# Patient Record
Sex: Male | Born: 1950 | Race: White | Hispanic: No | Marital: Single | Smoking: Current every day smoker
Health system: Southern US, Community
[De-identification: ages and names within clinical notes are randomized; demographics above are authoritative.]

## PROBLEM LIST (undated history)

## (undated) DIAGNOSIS — F329 Major depressive disorder, single episode, unspecified: Secondary | ICD-10-CM

## (undated) DIAGNOSIS — F419 Anxiety disorder, unspecified: Secondary | ICD-10-CM

## (undated) DIAGNOSIS — M545 Low back pain, unspecified: Secondary | ICD-10-CM

## (undated) DIAGNOSIS — E7800 Pure hypercholesterolemia, unspecified: Secondary | ICD-10-CM

## (undated) DIAGNOSIS — F32A Depression, unspecified: Secondary | ICD-10-CM

## (undated) DIAGNOSIS — G43909 Migraine, unspecified, not intractable, without status migrainosus: Secondary | ICD-10-CM

## (undated) DIAGNOSIS — R413 Other amnesia: Secondary | ICD-10-CM

## (undated) DIAGNOSIS — R630 Anorexia: Secondary | ICD-10-CM

## (undated) DIAGNOSIS — N529 Male erectile dysfunction, unspecified: Secondary | ICD-10-CM

## (undated) DIAGNOSIS — N400 Enlarged prostate without lower urinary tract symptoms: Secondary | ICD-10-CM

## (undated) DIAGNOSIS — F320 Major depressive disorder, single episode, mild: Secondary | ICD-10-CM

## (undated) DIAGNOSIS — K760 Fatty (change of) liver, not elsewhere classified: Secondary | ICD-10-CM

## (undated) DIAGNOSIS — G44009 Cluster headache syndrome, unspecified, not intractable: Secondary | ICD-10-CM

## (undated) HISTORY — PX: SKIN CANCER EXCISION: SHX779

## (undated) HISTORY — DX: Anorexia: R63.0

## (undated) HISTORY — DX: Pure hypercholesterolemia, unspecified: E78.00

## (undated) HISTORY — DX: Major depressive disorder, single episode, mild: F32.0

## (undated) HISTORY — DX: Other amnesia: R41.3

## (undated) HISTORY — DX: Benign prostatic hyperplasia without lower urinary tract symptoms: N40.0

## (undated) HISTORY — DX: Depression, unspecified: F32.A

## (undated) HISTORY — DX: Low back pain, unspecified: M54.50

## (undated) HISTORY — DX: Fatty (change of) liver, not elsewhere classified: K76.0

## (undated) HISTORY — DX: Anxiety disorder, unspecified: F41.9

## (undated) HISTORY — DX: Cluster headache syndrome, unspecified, not intractable: G44.009

## (undated) HISTORY — PX: ANKLE SURGERY: SHX546

## (undated) HISTORY — DX: Migraine, unspecified, not intractable, without status migrainosus: G43.909

## (undated) HISTORY — PX: APPENDECTOMY: SHX54

## (undated) HISTORY — DX: Male erectile dysfunction, unspecified: N52.9

## (undated) HISTORY — DX: Major depressive disorder, single episode, unspecified: F32.9

---

## 2006-07-08 ENCOUNTER — Emergency Department (HOSPITAL_COMMUNITY): Admission: EM | Admit: 2006-07-08 | Discharge: 2006-07-08 | Payer: Self-pay | Admitting: Emergency Medicine

## 2006-07-09 ENCOUNTER — Emergency Department (HOSPITAL_COMMUNITY): Admission: EM | Admit: 2006-07-09 | Discharge: 2006-07-09 | Payer: Self-pay | Admitting: Emergency Medicine

## 2006-07-17 ENCOUNTER — Emergency Department (HOSPITAL_COMMUNITY): Admission: EM | Admit: 2006-07-17 | Discharge: 2006-07-17 | Payer: Self-pay | Admitting: Emergency Medicine

## 2013-06-25 ENCOUNTER — Other Ambulatory Visit: Payer: Self-pay | Admitting: Family Medicine

## 2013-06-25 ENCOUNTER — Ambulatory Visit
Admission: RE | Admit: 2013-06-25 | Discharge: 2013-06-25 | Disposition: A | Discharge status: Home / Self Care | Payer: 59 | Source: Ambulatory Visit | Attending: Family Medicine | Admitting: Family Medicine

## 2013-06-25 DIAGNOSIS — W19XXXA Unspecified fall, initial encounter: Secondary | ICD-10-CM

## 2013-06-25 DIAGNOSIS — R0789 Other chest pain: Secondary | ICD-10-CM

## 2013-06-25 DIAGNOSIS — R05 Cough: Secondary | ICD-10-CM

## 2013-06-25 DIAGNOSIS — R059 Cough, unspecified: Secondary | ICD-10-CM

## 2013-08-12 DIAGNOSIS — C4492 Squamous cell carcinoma of skin, unspecified: Secondary | ICD-10-CM

## 2013-08-12 DIAGNOSIS — C4491 Basal cell carcinoma of skin, unspecified: Secondary | ICD-10-CM

## 2013-08-12 HISTORY — DX: Squamous cell carcinoma of skin, unspecified: C44.92

## 2013-08-12 HISTORY — DX: Basal cell carcinoma of skin, unspecified: C44.91

## 2016-03-23 DIAGNOSIS — C4491 Basal cell carcinoma of skin, unspecified: Secondary | ICD-10-CM

## 2016-03-23 HISTORY — DX: Basal cell carcinoma of skin, unspecified: C44.91

## 2016-12-21 ENCOUNTER — Ambulatory Visit: Payer: Self-pay

## 2016-12-21 ENCOUNTER — Encounter: Payer: Self-pay | Admitting: Podiatry

## 2016-12-21 DIAGNOSIS — S90121A Contusion of right lesser toe(s) without damage to nail, initial encounter: Secondary | ICD-10-CM | POA: Diagnosis not present

## 2016-12-22 ENCOUNTER — Other Ambulatory Visit: Payer: Self-pay | Admitting: Podiatry

## 2016-12-22 ENCOUNTER — Encounter: Payer: Self-pay | Admitting: Podiatry

## 2016-12-22 ENCOUNTER — Ambulatory Visit (INDEPENDENT_AMBULATORY_CARE_PROVIDER_SITE_OTHER): Payer: BLUE CROSS/BLUE SHIELD | Admitting: Podiatry

## 2016-12-22 ENCOUNTER — Ambulatory Visit (INDEPENDENT_AMBULATORY_CARE_PROVIDER_SITE_OTHER): Payer: BLUE CROSS/BLUE SHIELD

## 2016-12-22 VITALS — BP 133/80 | HR 77 | Resp 16

## 2016-12-22 DIAGNOSIS — L03032 Cellulitis of left toe: Secondary | ICD-10-CM | POA: Diagnosis not present

## 2016-12-22 DIAGNOSIS — S99921A Unspecified injury of right foot, initial encounter: Secondary | ICD-10-CM

## 2016-12-22 DIAGNOSIS — M79671 Pain in right foot: Secondary | ICD-10-CM

## 2016-12-22 MED ORDER — CIPROFLOXACIN HCL 500 MG PO TABS: 500.0000 mg | ORAL_TABLET | Freq: Two times a day (BID) | ORAL | 0 refills | Status: DC

## 2016-12-22 MED ORDER — CEPHALEXIN 500 MG PO CAPS: 500.0000 mg | ORAL_CAPSULE | Freq: Three times a day (TID) | ORAL | 1 refills | Status: DC

## 2016-12-22 NOTE — Progress Notes (Signed)
Subjective:    Patient ID: Timothy Santiago, male   DOB: 66 y.o.   MRN: [id number]   HPI patient presents having severe injury to his left toenail with severe tissue formation and pain and states that it happened 3 days ago patient does not smoke currently    Review of Systems  All other systems reviewed and are negative.       Objective:  Physical Exam  Constitutional: He appears well-developed and well-nourished.  Cardiovascular: Intact distal pulses.   Musculoskeletal: Normal range of motion.  Neurological: He is alert.  Skin: Skin is warm.  Nursing note and vitals reviewed.  neurovascular status intact muscle strength adequate range of motion was within normal limits with patient found to have severely damaged left hallux nailbed with dorsal tissue formation and indications of probable underlying bone trauma. It is localized to this area and is painful when palpated     Assessment:    Severe abscess left hallux with probable bone trauma secondary to sustained injury from 3 days ago     Plan:    H&P x-ray reviewed condition discussed. At this time I went ahead and I infiltrated left hallux 60 Milligan times like Marcaine mixture and under sterile conditions I removed the nail and found there is some trauma to the underlying bone structure but it is localized and appears to have a covering with no indication of exposed bone. I did review case with Dr. Jacqualyn Posey and had him look at it with me and we'll both in agreement. I flushed the area out applied sterile dressing instructed him on soaks and dressing usage and open toed shoes and I placed him on Cipro and cephalexin as precautionary measures with the explanation it's possible it bone infection were to occur he may ultimately require a amputation. Patient be reevaluated again in 1 week or earlier if any issues should occur  X-rays indicate there is trauma around the distal phalanx of the hallux but no indications of complete crush  injury

## 2016-12-22 NOTE — Patient Instructions (Signed)

## 2016-12-29 ENCOUNTER — Ambulatory Visit (INDEPENDENT_AMBULATORY_CARE_PROVIDER_SITE_OTHER): Payer: BLUE CROSS/BLUE SHIELD | Admitting: Podiatry

## 2016-12-29 DIAGNOSIS — L03032 Cellulitis of left toe: Secondary | ICD-10-CM

## 2016-12-29 DIAGNOSIS — S99921A Unspecified injury of right foot, initial encounter: Secondary | ICD-10-CM

## 2016-12-29 NOTE — Progress Notes (Signed)
Subjective:    Patient ID: Timothy Santiago, male   DOB: 66 y.o.   MRN: [id number]   HPI patient states that it seems somewhat better but it still throbs at night and there is still drainage and redness    ROS      Objective:  Physical Exam neurovascular status intact with patient's left hallux still mildly red to the interphalangeal joint local with no proximal edema erythema drainage and there still is drainage on the dorsal surface of the left nail bed but I do not see any bone exposure and it appears to be healing over     Assessment:    Hopeful that we are getting coverage of this severely traumatized left hallux with discomfort still present     Plan:    Advised on the continuation of soaks open toed shoes and we will continue antibiotics for 10 more days. I then advised that it still is possible he will require amputation or may have bone infection but at this point things are looking more promising and he will continue along the same course. Strict instructions if any proximal redness or any systemic signs of infection were to occur he is to go straight to the emergency room and if not we will see him back in 3 weeks

## 2017-01-03 NOTE — Progress Notes (Signed)
This encounter was created in error - please disregard.

## 2017-01-19 ENCOUNTER — Ambulatory Visit: Payer: BLUE CROSS/BLUE SHIELD | Admitting: Podiatry

## 2017-01-22 ENCOUNTER — Encounter: Payer: Self-pay | Admitting: Podiatry

## 2017-01-22 ENCOUNTER — Other Ambulatory Visit: Payer: Self-pay | Admitting: Podiatry

## 2017-01-22 ENCOUNTER — Ambulatory Visit (INDEPENDENT_AMBULATORY_CARE_PROVIDER_SITE_OTHER): Payer: BLUE CROSS/BLUE SHIELD | Admitting: Podiatry

## 2017-01-22 ENCOUNTER — Ambulatory Visit (INDEPENDENT_AMBULATORY_CARE_PROVIDER_SITE_OTHER): Payer: BLUE CROSS/BLUE SHIELD

## 2017-01-22 DIAGNOSIS — M79675 Pain in left toe(s): Secondary | ICD-10-CM

## 2017-01-22 DIAGNOSIS — S99929A Unspecified injury of unspecified foot, initial encounter: Secondary | ICD-10-CM

## 2017-01-22 DIAGNOSIS — S99921A Unspecified injury of right foot, initial encounter: Secondary | ICD-10-CM | POA: Diagnosis not present

## 2017-01-22 DIAGNOSIS — L03032 Cellulitis of left toe: Secondary | ICD-10-CM

## 2017-01-23 NOTE — Progress Notes (Signed)
Subjective:    Patient ID: Timothy Santiago, male   DOB: 66 y.o.   MRN: [id number]   HPI patient presents stating I'm not getting any drainage but I still gets some soreness and I've not been wearing shoes on my left big toe    ROS      Objective:  Physical Exam patient had traumatized his left big toe and had bone exposure which appears to be healed over but cannot rule out bone infection     Assessment:   Seems to be improving with probability of crusted-like tissue but no indications currently that there is a bone infection      Plan:     X-ray reviewed with patient and allow patient to return to normal shoe and applied cushion into the top of the toe to try to take pressure off. I discussed one point I may need to remove the nail and that the chances of amputation still exist but are small currently  X-ray indicates there is no indication of bone infection at this time

## 2017-02-14 DIAGNOSIS — F411 Generalized anxiety disorder: Secondary | ICD-10-CM | POA: Diagnosis not present

## 2017-02-14 DIAGNOSIS — Z0001 Encounter for general adult medical examination with abnormal findings: Secondary | ICD-10-CM | POA: Diagnosis not present

## 2017-02-14 DIAGNOSIS — R05 Cough: Secondary | ICD-10-CM | POA: Diagnosis not present

## 2017-02-14 DIAGNOSIS — R197 Diarrhea, unspecified: Secondary | ICD-10-CM | POA: Diagnosis not present

## 2017-02-14 DIAGNOSIS — E7800 Pure hypercholesterolemia, unspecified: Secondary | ICD-10-CM | POA: Diagnosis not present

## 2017-02-14 DIAGNOSIS — N400 Enlarged prostate without lower urinary tract symptoms: Secondary | ICD-10-CM | POA: Diagnosis not present

## 2017-02-14 DIAGNOSIS — Z23 Encounter for immunization: Secondary | ICD-10-CM | POA: Diagnosis not present

## 2017-03-21 DIAGNOSIS — N400 Enlarged prostate without lower urinary tract symptoms: Secondary | ICD-10-CM | POA: Diagnosis not present

## 2017-03-21 DIAGNOSIS — J069 Acute upper respiratory infection, unspecified: Secondary | ICD-10-CM | POA: Diagnosis not present

## 2017-03-21 DIAGNOSIS — F411 Generalized anxiety disorder: Secondary | ICD-10-CM | POA: Diagnosis not present

## 2017-05-14 DIAGNOSIS — D0439 Carcinoma in situ of skin of other parts of face: Secondary | ICD-10-CM | POA: Diagnosis not present

## 2017-05-14 DIAGNOSIS — L570 Actinic keratosis: Secondary | ICD-10-CM | POA: Diagnosis not present

## 2017-05-14 DIAGNOSIS — C44629 Squamous cell carcinoma of skin of left upper limb, including shoulder: Secondary | ICD-10-CM | POA: Diagnosis not present

## 2017-05-14 DIAGNOSIS — D229 Melanocytic nevi, unspecified: Secondary | ICD-10-CM | POA: Diagnosis not present

## 2017-05-26 DIAGNOSIS — J101 Influenza due to other identified influenza virus with other respiratory manifestations: Secondary | ICD-10-CM | POA: Diagnosis not present

## 2017-05-26 DIAGNOSIS — R509 Fever, unspecified: Secondary | ICD-10-CM | POA: Diagnosis not present

## 2017-06-01 DIAGNOSIS — J069 Acute upper respiratory infection, unspecified: Secondary | ICD-10-CM | POA: Diagnosis not present

## 2017-06-18 DIAGNOSIS — G44009 Cluster headache syndrome, unspecified, not intractable: Secondary | ICD-10-CM | POA: Diagnosis not present

## 2017-10-04 DIAGNOSIS — M25561 Pain in right knee: Secondary | ICD-10-CM | POA: Diagnosis not present

## 2017-10-04 DIAGNOSIS — M1711 Unilateral primary osteoarthritis, right knee: Secondary | ICD-10-CM | POA: Diagnosis not present

## 2017-12-06 ENCOUNTER — Ambulatory Visit
Admission: RE | Admit: 2017-12-06 | Discharge: 2017-12-06 | Disposition: A | Discharge status: Home / Self Care | Payer: BLUE CROSS/BLUE SHIELD | Source: Ambulatory Visit | Attending: Family Medicine | Admitting: Family Medicine

## 2017-12-06 ENCOUNTER — Other Ambulatory Visit: Payer: Self-pay

## 2017-12-06 DIAGNOSIS — R109 Unspecified abdominal pain: Secondary | ICD-10-CM

## 2017-12-06 DIAGNOSIS — K5900 Constipation, unspecified: Secondary | ICD-10-CM | POA: Diagnosis not present

## 2017-12-06 DIAGNOSIS — Z1211 Encounter for screening for malignant neoplasm of colon: Secondary | ICD-10-CM | POA: Diagnosis not present

## 2017-12-13 ENCOUNTER — Other Ambulatory Visit: Payer: Self-pay | Admitting: Family Medicine

## 2017-12-13 DIAGNOSIS — R109 Unspecified abdominal pain: Secondary | ICD-10-CM

## 2018-01-01 ENCOUNTER — Other Ambulatory Visit: Payer: Self-pay | Admitting: Gastroenterology

## 2018-01-01 DIAGNOSIS — R1084 Generalized abdominal pain: Secondary | ICD-10-CM | POA: Diagnosis not present

## 2018-01-01 DIAGNOSIS — R110 Nausea: Secondary | ICD-10-CM | POA: Diagnosis not present

## 2018-01-01 DIAGNOSIS — R630 Anorexia: Secondary | ICD-10-CM | POA: Diagnosis not present

## 2018-01-15 ENCOUNTER — Ambulatory Visit
Admission: RE | Admit: 2018-01-15 | Discharge: 2018-01-15 | Disposition: A | Discharge status: Home / Self Care | Payer: BLUE CROSS/BLUE SHIELD | Source: Ambulatory Visit | Attending: Gastroenterology | Admitting: Gastroenterology

## 2018-01-15 DIAGNOSIS — R1084 Generalized abdominal pain: Secondary | ICD-10-CM

## 2018-01-15 DIAGNOSIS — K760 Fatty (change of) liver, not elsewhere classified: Secondary | ICD-10-CM | POA: Diagnosis not present

## 2018-01-15 MED ORDER — IOPAMIDOL (ISOVUE-300) INJECTION 61%
100.0000 mL | Freq: Once | INTRAVENOUS | Status: AC | PRN
  Administered 2018-01-15: 125 mL via INTRAVENOUS

## 2018-01-22 DIAGNOSIS — K760 Fatty (change of) liver, not elsewhere classified: Secondary | ICD-10-CM | POA: Diagnosis not present

## 2018-01-30 DIAGNOSIS — K3189 Other diseases of stomach and duodenum: Secondary | ICD-10-CM | POA: Diagnosis not present

## 2018-01-30 DIAGNOSIS — R1110 Vomiting, unspecified: Secondary | ICD-10-CM | POA: Diagnosis not present

## 2018-01-30 DIAGNOSIS — K2930 Chronic superficial gastritis without bleeding: Secondary | ICD-10-CM | POA: Diagnosis not present

## 2018-02-01 DIAGNOSIS — K2930 Chronic superficial gastritis without bleeding: Secondary | ICD-10-CM | POA: Diagnosis not present

## 2018-04-05 DIAGNOSIS — F320 Major depressive disorder, single episode, mild: Secondary | ICD-10-CM | POA: Diagnosis not present

## 2018-04-05 DIAGNOSIS — R413 Other amnesia: Secondary | ICD-10-CM | POA: Diagnosis not present

## 2018-04-05 DIAGNOSIS — M25561 Pain in right knee: Secondary | ICD-10-CM | POA: Diagnosis not present

## 2018-04-05 DIAGNOSIS — F411 Generalized anxiety disorder: Secondary | ICD-10-CM | POA: Diagnosis not present

## 2018-04-16 ENCOUNTER — Other Ambulatory Visit: Payer: Self-pay

## 2018-04-16 ENCOUNTER — Encounter: Payer: Self-pay | Admitting: Neurology

## 2018-04-16 ENCOUNTER — Ambulatory Visit (INDEPENDENT_AMBULATORY_CARE_PROVIDER_SITE_OTHER): Payer: BLUE CROSS/BLUE SHIELD | Admitting: Neurology

## 2018-04-16 VITALS — BP 169/105 | HR 67 | Ht 76.0 in | Wt 270.0 lb

## 2018-04-16 DIAGNOSIS — R413 Other amnesia: Secondary | ICD-10-CM | POA: Insufficient documentation

## 2018-04-16 DIAGNOSIS — E559 Vitamin D deficiency, unspecified: Secondary | ICD-10-CM | POA: Diagnosis not present

## 2018-04-16 DIAGNOSIS — F332 Major depressive disorder, recurrent severe without psychotic features: Secondary | ICD-10-CM

## 2018-04-16 DIAGNOSIS — R0683 Snoring: Secondary | ICD-10-CM | POA: Diagnosis not present

## 2018-04-16 MED ORDER — BUPROPION HCL ER (XL) 300 MG PO TB24: 300.0000 mg | ORAL_TABLET | Freq: Every day | ORAL | 11 refills | Status: DC

## 2018-04-16 NOTE — Progress Notes (Signed)
[facility]  PATIENT: Timothy Santiago DOB: 1951/03/13  REFERRING DOCTOR OR PCP: Donnie Coffin, MD SOURCE: Patient, notes from Dr. Alroy Dust, reviewed ESS and MoCA scales  _________________________________   HISTORICAL  CHIEF COMPLAINT:  Chief Complaint  Patient presents with  . New Patient (Initial Visit)    RM 12, alone. Paper referral from Dr. Alroy Dust for memory problems. He reports he has severe depression currently. Pt reports sx worsened within the last year. PCP could not figure out what was going on. Pt has family hx of anxiety/depression. He has been on antidepressant in the past but cannot remember the name. It was ineffective. He has also been on medication for anxiety.   . Memory Loss    Having general memory issues. Oldest son has ADD. He has been told he does as well. He has tried ritalin in the past but could not tolerate.  Feels mind is in a "cloud". Cannot focus. Having more difficulty with short term memory.     HISTORY OF PRESENT ILLNESS:  I had the pleasure of seeing your patient, Timothy Santiago, at [facility] for neurologic consultation regarding his memory loss.  He is a 68 y.o. man who first noted difficulty with short term memory about a year ago.  His family has noted that he often repeats himself.   In general, he notes more difficulty with short term memory than his family or others.     He is working and travels a lot for his job.   He is in Press photographer (food products to schools).  He recalls recently filling up on gas to return his car rental and then turning into a gas station to fill up again.   He does feel that he is having more trouble on his job.    He has never been good at math but is not noting mor errors with the financial component of his job.   He has to plan trips and talks.  He notes depression and also has attention deficit disorder.   He sees Dr. Alroy Dust.   The duloxetine was recently increased from 60 to 120  mg.   He also has had anxiety.   He notes most of his family has depression.    He has had trouble with mood issues x many years.   In the past, he had tried Ritalin but felt like he drank 30 cups of coffee so did not continue.   He feels very depressed but notes no suicidal ideation.    He has apathy -- no longer cleaning hte house and not doing activities he once enjoyed.    He has been drinking more, 2-4 drinks a day.    He feels it relaxes him.   His first depression occurred in his 33's.      He has some insomnia but averages 7 hours.   He snores but has not been noted to have pauses, snorts, gasps.   He sleeps alone, however, so only has a couple nights a year where his son is present at night.   He has never had a sleep study.      He denies significant medical illnesses.   No thyroid disease, known vitamin deficiency, DM.      He has no brain imaging.     His paternal grandmother had dementia at a late age.       Montreal Cognitive Assessment  04/16/2018  Visuospatial/ Executive (0/5) 3  Naming (0/3) 3  Attention: Read list of digits (0/2) 2  Attention: Read list of letters (0/1) 1  Attention: Serial 7 subtraction starting at 100 (0/3) 3  Language: Repeat phrase (0/2) 2  Language : Fluency (0/1) 0  Abstraction (0/2) 2  Delayed Recall (0/5) 2  Orientation (0/6) 6  Total 24  Adjusted Score (based on education) 24     EPWORTH SLEEPINESS SCALE  On a scale of 0 - 3 what is the chance of dozing:  Sitting and Reading:   0 Watching TV:    2 Sitting inactive in a public place:  1 Passenger in car for one hour:  2 Lying down to rest in the afternoon: 0 Sitting and talking to someone:  0 Sitting quietly after lunch:  0 In a car, stopped in traffic:  0  Total (out of 24):   5/24  Normal.   REVIEW OF SYSTEMS: Constitutional: No fevers, chills, sweats, or change in appetite.  He has occasional insomnia Eyes: No visual changes, double vision, eye pain Ear, nose and throat: No  hearing loss, ear pain, nasal congestion, sore throat Cardiovascular: No chest pain, palpitations Respiratory: No shortness of breath at rest or with exertion.   No wheezes.  He snores,  GastrointestinaI: No nausea, vomiting, diarrhea, abdominal pain, fecal incontinence Genitourinary: No dysuria, urinary retention or frequency.  No nocturia. Musculoskeletal: No neck pain, back pain Integumentary: No rash, pruritus, skin lesions Neurological: as above Psychiatric: No depression at this time.  No anxiety Endocrine: No palpitations, diaphoresis, change in appetite, change in weigh or increased thirst Hematologic/Lymphatic: No anemia, purpura, petechiae. Allergic/Immunologic: No itchy/runny eyes, nasal congestion, recent allergic reactions, rashes  ALLERGIES: Allergies  Allergen Reactions  . Codeine     Upset stomach    HOME MEDICATIONS:  Current Outpatient Medications:  .  ALPRAZolam (XANAX) 0.25 MG tablet, Take 0.5 mg by mouth at bedtime as needed for anxiety., Disp: , Rfl:  .  busPIRone (BUSPAR) 15 MG tablet, Take 15 mg by mouth 2 (two) times daily., Disp: , Rfl:  .  DULoxetine (CYMBALTA) 60 MG capsule, Take 60 mg by mouth daily., Disp: , Rfl:  .  tamsulosin (FLOMAX) 0.4 MG CAPS capsule, Take 0.8 mg by mouth daily., Disp: , Rfl:  .  buPROPion (WELLBUTRIN XL) 300 MG 24 hr tablet, Take 1 tablet (300 mg total) by mouth daily., Disp: 30 tablet, Rfl: 11  PAST MEDICAL HISTORY: Past Medical History:  Diagnosis Date  . Anxiety   . Depression   . Migraine     PAST SURGICAL HISTORY: Past Surgical History:  Procedure Laterality Date  . ANKLE SURGERY Left   . APPENDECTOMY    . SKIN CANCER EXCISION      FAMILY HISTORY: Family History  Problem Relation Age of Onset  . Depression Father   . Depression Sister   . Anxiety disorder Sister     SOCIAL HISTORY:  Social History   Socioeconomic History  . Marital status: Single    Spouse name: Not on file  . Number of  children: 2  . Years of education: 47  . Highest education level: Not on file  Occupational History  . Occupation: Investment banker, corporate  Social Needs  . Financial resource strain: Not on file  . Food insecurity:    Worry: Not on file    Inability: Not on file  . Transportation needs:    Medical: Not on file    Non-medical: Not on file  Tobacco Use  . Smoking  status: Smoker, Current Status Unknown    Packs/day: 1.00  . Smokeless tobacco: Former Network engineer and Sexual Activity  . Alcohol use: Yes    Comment: 1-4 drinks per day  . Drug use: Never  . Sexual activity: Not on file  Lifestyle  . Physical activity:    Days per week: Not on file    Minutes per session: Not on file  . Stress: Not on file  Relationships  . Social connections:    Talks on phone: Not on file    Gets together: Not on file    Attends religious service: Not on file    Active member of club or organization: Not on file    Attends meetings of clubs or organizations: Not on file    Relationship status: Not on file  . Intimate partner violence:    Fear of current or ex partner: Not on file    Emotionally abused: Not on file    Physically abused: Not on file    Forced sexual activity: Not on file  Other Topics Concern  . Not on file  Social History Narrative   Right handed    Caffeine use: coffee 2-3 x per week   Lives alone     PHYSICAL EXAM  Vitals:   04/16/18 1451  BP: (!) 169/105  Pulse: 67  Weight: 270 lb (122.5 kg)  Height: 6\' 4"  (1.93 m)    Body mass index is 32.87 kg/m.   General: The patient is well-developed and well-nourished and in no acute distress.  Pharynx is Mallampati 2  Neck: The neck is supple, no carotid bruits are noted.  The neck is nontender with good range of motion  Cardiovascular: The heart has a regular rate and rhythm with a normal S1 and S2. There were no murmurs, gallops or rubs.    Skin: Extremities are without rash or edema.  Neurologic  Exam  Mental status: The patient is alert and oriented x 3 at the time of the examination.  He scored 20/30 on the [facility] cognitive assessment.  Individual scores are in the history.  Speech is normal.  Cranial nerves: Extraocular movements are full. There is good facial sensation to soft touch bilaterally.Facial strength is normal.  Trapezius and sternocleidomastoid strength is normal. No dysarthria is noted.  The tongue is midline, and the patient has symmetric elevation of the soft palate. No obvious hearing deficits are noted.  Motor:  Muscle bulk is normal.   Tone is normal. Strength is  5 / 5 in all 4 extremities.   Sensory: Sensory testing is intact to pinprick, soft touch and vibration sensation in all 4 extremities.  Coordination: Cerebellar testing reveals good finger-nose-finger and heel-to-shin bilaterally.  Gait and station: Station is normal.   Gait is normal. Tandem gait is normal. Romberg is negative.   Reflexes: Deep tendon reflexes are symmetric and normal bilaterally.   Plantar responses are flexor.    DIAGNOSTIC DATA (LABS, IMAGING, TESTING) - I reviewed patient records, labs, notes, testing and imaging myself where available.       ASSESSMENT AND PLAN  Severe episode of recurrent major depressive disorder, without psychotic features ([place])  Memory loss - Plan: Thyroid Panel With TSH, Vitamin B12, MR BRAIN WO CONTRAST  Vitamin D deficiency - Plan: VITAMIN D 25 Hydroxy (Vit-D Deficiency, Fractures)  Snoring   In summary, Timothy Santiago is a 68 year old man with a 1 year history of memory loss.  He he reports being more  aware of his memory difficulties than others.  He continues to work.  He also has had difficulties with depression that has been worse the past year.  I had a long discussion with Timothy Santiago about memory difficulties.  In middle age, issues affecting focus and attention are more likely to cause difficulties with memory and degenerative processes  such as Alzheimer's.  I am concerned that his depression is fairly severe and has not responded well to therapy so far.  His Cymbalta was recently increased.  I am going to add Wellbutrin to see if that would benefit him further.  Because he also missed some points on the [facility] cognitive assessment for other topics besides recall, it is still possible that there is a degenerative process.  We will check an MRI of the brain to determine if it is compatible with Alzheimer's disease (hippocampal atrophy) frontotemporal dementia or vascular dementia.  Poor sleep can also lead to cognitive dysfunction and middle-age.  He does have snoring but does not have excessive daytime sleepiness.  If he does not improve and the initial evaluation is negative, we will consider a sleep study.  Additionally I will check blood work including TSH, vitamin D and vitamin B12 we will supplement  as needed.  He will return to see me in 3 months or sooner if there are new or worsening neurologic symptoms.  Thank you for asking me to see Timothy Santiago.  Please let me know if I can be of further assistance with him or other patients in the future.   Richard A. Felecia Shelling, MD, [facility] 7/97/2820, 60:15 PM Certified in Neurology, Clinical Neurophysiology, Sleep Medicine, Pain Medicine and Neuroimaging  [facility] [address], [place], [place] [address] [phone]

## 2018-04-17 ENCOUNTER — Telehealth: Payer: Self-pay | Admitting: *Deleted

## 2018-04-17 LAB — THYROID PANEL WITH TSH
FREE THYROXINE INDEX: 2 (ref 1.2–4.9)
T3 UPTAKE RATIO: 28 % (ref 24–39)
T4, Total: 7.1 ug/dL (ref 4.5–12.0)
TSH: 2.41 u[IU]/mL (ref 0.450–4.500)

## 2018-04-17 LAB — VITAMIN B12: Vitamin B-12: 519 pg/mL (ref 232–1245)

## 2018-04-17 LAB — VITAMIN D 25 HYDROXY (VIT D DEFICIENCY, FRACTURES): Vit D, 25-Hydroxy: 6.6 ng/mL — ABNORMAL LOW (ref 30.0–100.0)

## 2018-04-17 MED ORDER — VITAMIN D (ERGOCALCIFEROL) 1.25 MG (50000 UNIT) PO CAPS: 50000.0000 [IU] | ORAL_CAPSULE | ORAL | 3 refills | Status: AC

## 2018-04-17 NOTE — Telephone Encounter (Signed)
-----   Message from Britt Bottom, MD sent at 04/17/2018  2:37 PM EST ----- Vitamin D is very low and he needs high-dose supplementation.  50,000 units weekly x1 year   #13  # 3 refills    Other labs were fine

## 2018-04-17 NOTE — Telephone Encounter (Signed)
Called, LVM for pt about lab results. Gave [facility] phone number if he has further questions.  [facility].

## 2018-04-18 ENCOUNTER — Telehealth: Payer: Self-pay | Admitting: Neurology

## 2018-04-18 NOTE — Telephone Encounter (Signed)
Medicare/BCBS Auth: [id number] (exp. 04/18/18 to 06/17/18) lvm for pt to be aware. I also left [facility] phone number of [phone] and to give them a call in the next 2-3 business days if he hasn't heard.

## 2018-05-11 ENCOUNTER — Ambulatory Visit
Admission: RE | Admit: 2018-05-11 | Discharge: 2018-05-11 | Disposition: A | Discharge status: Home / Self Care | Payer: BLUE CROSS/BLUE SHIELD | Source: Ambulatory Visit | Attending: Neurology | Admitting: Neurology

## 2018-05-11 DIAGNOSIS — R413 Other amnesia: Secondary | ICD-10-CM | POA: Diagnosis not present

## 2018-05-17 ENCOUNTER — Telehealth: Payer: Self-pay | Admitting: *Deleted

## 2018-05-17 NOTE — Telephone Encounter (Signed)
-----   Message from Britt Bottom, MD sent at 05/17/2018  1:03 PM EST ----- please let him know the MRI of the brain does show some atrophy and more age related changes than is typical.   This is likely contributing to his memory issues

## 2018-05-17 NOTE — Telephone Encounter (Signed)
I called and spoke with pt about MRI results. Pt verbalized understanding.

## 2018-06-27 ENCOUNTER — Telehealth: Payer: Self-pay | Admitting: Neurology

## 2018-06-27 MED ORDER — DULOXETINE HCL 60 MG PO CPEP: 60.0000 mg | ORAL_CAPSULE | Freq: Every day | ORAL | 5 refills | Status: DC

## 2018-06-27 NOTE — Telephone Encounter (Signed)
Pt states he was told Dr Felecia Shelling would increase his taking DULoxetine (CYMBALTA) 60 MG capsule once a day to twice a day.  Pt states the pharmacy  [id number] has never received the revised prescription for him going from 1 a day to 2 a day.  Pt is asking the script be sent to his pharmacy as he will soon run out of remaining pills

## 2018-06-27 NOTE — Telephone Encounter (Signed)
I called and spoke with patient and made him aware of Dr. Garth Bigness recommendations. He voiced understanding and appreciation.

## 2018-06-27 NOTE — Addendum Note (Signed)
Addended by: Arlice Colt A on: 06/27/2018 04:03 PM   Modules accepted: Orders

## 2018-07-02 DIAGNOSIS — D485 Neoplasm of uncertain behavior of skin: Secondary | ICD-10-CM | POA: Diagnosis not present

## 2018-07-02 DIAGNOSIS — L570 Actinic keratosis: Secondary | ICD-10-CM | POA: Diagnosis not present

## 2018-07-18 DIAGNOSIS — C44622 Squamous cell carcinoma of skin of right upper limb, including shoulder: Secondary | ICD-10-CM | POA: Diagnosis not present

## 2018-10-22 ENCOUNTER — Ambulatory Visit: Payer: Medicare Other | Admitting: Neurology

## 2018-10-23 ENCOUNTER — Encounter: Payer: Self-pay | Admitting: Neurology

## 2018-11-13 ENCOUNTER — Encounter: Payer: Self-pay | Admitting: Neurology

## 2018-11-13 ENCOUNTER — Ambulatory Visit: Payer: Medicare Other | Admitting: Neurology

## 2018-11-20 ENCOUNTER — Telehealth: Payer: Self-pay | Admitting: Neurology

## 2018-11-20 DIAGNOSIS — F1020 Alcohol dependence, uncomplicated: Secondary | ICD-10-CM

## 2018-11-20 DIAGNOSIS — F339 Major depressive disorder, recurrent, unspecified: Secondary | ICD-10-CM

## 2018-11-20 NOTE — Telephone Encounter (Signed)
Dr. Felecia Shelling- would you like him to see psychiatry? I see a note from 06/2018 that you would recommend that next

## 2018-11-20 NOTE — Telephone Encounter (Signed)
Because he is continued to have difficulty with depression despite changes in medication, I think we need to refer him to psychiatry for further evaluation.  If he feels suicidal or that he might hurt himself or others he should present to the emergency room.

## 2018-11-20 NOTE — Telephone Encounter (Signed)
Pt has called and rescheduled his 3 month f/u and he is on wait list.  Pt states that it is very important that he be seen as soon as possible because of his depression.  Pt states the medication called in for him worked for about 2-3 weeks but no longer.  Pt states he is an alcoholic and has recently loss his job because of his issues.  Please call

## 2018-11-20 NOTE — Telephone Encounter (Signed)
I called and spoke with pt. I relayed Dr. Garth Bigness message. He denies having any thoughts of suicide or hurting himself. He knows to go to ED if he does. He is agreeable to referral to psychiatry. He does not have preference of who to be referred to. I placed referreal. Advised he will be called to schedule appt. He verbalized understanding.

## 2018-11-20 NOTE — Addendum Note (Signed)
Addended by: Hope Pigeon on: 11/20/2018 03:25 PM   Modules accepted: Orders

## 2018-12-16 DIAGNOSIS — L814 Other melanin hyperpigmentation: Secondary | ICD-10-CM | POA: Diagnosis not present

## 2018-12-16 DIAGNOSIS — L570 Actinic keratosis: Secondary | ICD-10-CM | POA: Diagnosis not present

## 2018-12-16 DIAGNOSIS — D225 Melanocytic nevi of trunk: Secondary | ICD-10-CM | POA: Diagnosis not present

## 2018-12-16 DIAGNOSIS — D0439 Carcinoma in situ of skin of other parts of face: Secondary | ICD-10-CM | POA: Diagnosis not present

## 2018-12-16 DIAGNOSIS — L821 Other seborrheic keratosis: Secondary | ICD-10-CM | POA: Diagnosis not present

## 2018-12-16 DIAGNOSIS — C44622 Squamous cell carcinoma of skin of right upper limb, including shoulder: Secondary | ICD-10-CM | POA: Diagnosis not present

## 2018-12-16 DIAGNOSIS — Z85828 Personal history of other malignant neoplasm of skin: Secondary | ICD-10-CM | POA: Diagnosis not present

## 2018-12-16 DIAGNOSIS — C4441 Basal cell carcinoma of skin of scalp and neck: Secondary | ICD-10-CM | POA: Diagnosis not present

## 2018-12-16 DIAGNOSIS — C44729 Squamous cell carcinoma of skin of left lower limb, including hip: Secondary | ICD-10-CM | POA: Diagnosis not present

## 2018-12-16 DIAGNOSIS — D485 Neoplasm of uncertain behavior of skin: Secondary | ICD-10-CM | POA: Diagnosis not present

## 2019-01-15 ENCOUNTER — Telehealth: Payer: Self-pay | Admitting: Neurology

## 2019-01-15 NOTE — Telephone Encounter (Signed)
I called patient regarding confirming 01/16/19 appointment. I spoke with patient on the number listed in chart ([phone]) and I advised patient to arrive at our office at 12:30 to check-in for 1:00 appointment. I also advised patient that due to ongoing COVID-19 guidelines, it is encouraged that patient come alone to appointment if possible. Patient stated "okay" and we ended call.

## 2019-01-16 ENCOUNTER — Encounter

## 2019-01-16 ENCOUNTER — Ambulatory Visit: Payer: Medicare Other | Admitting: Neurology

## 2019-04-14 ENCOUNTER — Encounter: Payer: Self-pay | Admitting: Family Medicine

## 2019-04-14 ENCOUNTER — Ambulatory Visit: Payer: Medicare Other | Admitting: Family Medicine

## 2019-04-14 ENCOUNTER — Other Ambulatory Visit: Payer: Self-pay

## 2019-04-14 ENCOUNTER — Other Ambulatory Visit: Payer: Self-pay | Admitting: Neurology

## 2019-04-14 VITALS — BP 168/93 | HR 87 | Temp 97.1°F | Ht 76.0 in | Wt 243.4 lb

## 2019-04-14 DIAGNOSIS — F332 Major depressive disorder, recurrent severe without psychotic features: Secondary | ICD-10-CM

## 2019-04-14 DIAGNOSIS — R413 Other amnesia: Secondary | ICD-10-CM

## 2019-04-14 NOTE — Progress Notes (Signed)
I have read the note, and I agree with the clinical assessment and plan.  Khloie Hamada A. Thaine Garriga, MD, PhD, FAAN Certified in Neurology, Clinical Neurophysiology, Sleep Medicine, Pain Medicine and Neuroimaging  [facility] [address], [address] [place],  [address] [phone]  

## 2019-04-14 NOTE — Patient Instructions (Signed)
We will continue duloxetine and bupropion as prescribed   I have referred you to psychiatry to assist with medications and manage anxiety/depression  Follow up with [place] as needed once established with psychiatry.   Memory Compensation Strategies  1. Use "WARM" strategy.  W= write it down  A= associate it  R= repeat it  M= make a mental note  2.   You can keep a Social worker.  Use a 3-ring notebook with sections for the following: calendar, important names and phone numbers,  medications, doctors' names/phone numbers, lists/reminders, and a section to journal what you did  each day.   3.    Use a calendar to write appointments down.  4.    Write yourself a schedule for the day.  This can be placed on the calendar or in a separate section of the Memory Notebook.  Keeping a  regular schedule can help memory.  5.    Use medication organizer with sections for each day or morning/evening pills.  You may need help loading it  6.    Keep a basket, or pegboard by the door.  Place items that you need to take out with you in the basket or on the pegboard.  You may also want to  include a message board for reminders.  7.    Use sticky notes.  Place sticky notes with reminders in a place where the task is performed.  For example: " turn off the  stove" placed by the stove, "lock the door" placed on the door at eye level, " take your medications" on  the bathroom mirror or by the place where you normally take your medications.  8.    Use alarms/timers.  Use while cooking to remind yourself to check on food or as a reminder to take your medicine, or as a  reminder to make a call, or as a reminder to perform another task, etc.    Hypertension, Adult Hypertension is another name for high blood pressure. High blood pressure forces your heart to work harder to pump blood. This can cause problems over time. There are two numbers in a blood pressure reading. There is a top number (systolic) over a  bottom number (diastolic). It is best to have a blood pressure that is below 120/80. Healthy choices can help lower your blood pressure, or you may need medicine to help lower it. What are the causes? The cause of this condition is not known. Some conditions may be related to high blood pressure. What increases the risk?  Smoking.  Having type 2 diabetes mellitus, high cholesterol, or both.  Not getting enough exercise or physical activity.  Being overweight.  Having too much fat, sugar, calories, or salt (sodium) in your diet.  Drinking too much alcohol.  Having long-term (chronic) kidney disease.  Having a family history of high blood pressure.  Age. Risk increases with age.  Race. You may be at higher risk if you are African American.  Gender. Men are at higher risk than women before age 40. After age 21, women are at higher risk than men.  Having obstructive sleep apnea.  Stress. What are the signs or symptoms?  High blood pressure may not cause symptoms. Very high blood pressure (hypertensive crisis) may cause: ? Headache. ? Feelings of worry or nervousness (anxiety). ? Shortness of breath. ? Nosebleed. ? A feeling of being sick to your stomach (nausea). ? Throwing up (vomiting). ? Changes in how you see. ?  Very bad chest pain. ? Seizures. How is this treated?  This condition is treated by making healthy lifestyle changes, such as: ? Eating healthy foods. ? Exercising more. ? Drinking less alcohol.  Your health care provider may prescribe medicine if lifestyle changes are not enough to get your blood pressure under control, and if: ? Your top number is above 130. ? Your bottom number is above 80.  Your personal target blood pressure may vary. Follow these instructions at home: Eating and drinking   If told, follow the DASH eating plan. To follow this plan: ? Fill one half of your plate at each meal with fruits and vegetables. ? Fill one fourth of your  plate at each meal with whole grains. Whole grains include whole-wheat pasta, brown rice, and whole-grain bread. ? Eat or drink low-fat dairy products, such as skim milk or low-fat yogurt. ? Fill one fourth of your plate at each meal with low-fat (lean) proteins. Low-fat proteins include fish, chicken without skin, eggs, beans, and tofu. ? Avoid fatty meat, cured and processed meat, or chicken with skin. ? Avoid pre-made or processed food.  Eat less than 1,500 mg of salt each day.  Do not drink alcohol if: ? Your doctor tells you not to drink. ? You are pregnant, may be pregnant, or are planning to become pregnant.  If you drink alcohol: ? Limit how much you use to:  0-1 drink a day for women.  0-2 drinks a day for men. ? Be aware of how much alcohol is in your drink. In the [place] one drink equals one 12 oz bottle of beer (355 mL), one 5 oz glass of wine (148 mL), or one 1 oz glass of hard liquor (44 mL). Lifestyle   Work with your doctor to stay at a healthy weight or to lose weight. Ask your doctor what the best weight is for you.  Get at least 30 minutes of exercise most days of the week. This may include walking, swimming, or biking.  Get at least 30 minutes of exercise that strengthens your muscles (resistance exercise) at least 3 days a week. This may include lifting weights or doing Pilates.  Do not use any products that contain nicotine or tobacco, such as cigarettes, e-cigarettes, and chewing tobacco. If you need help quitting, ask your doctor.  Check your blood pressure at home as told by your doctor.  Keep all follow-up visits as told by your doctor. This is important. Medicines  Take over-the-counter and prescription medicines only as told by your doctor. Follow directions carefully.  Do not skip doses of blood pressure medicine. The medicine does not work as well if you skip doses. Skipping doses also puts you at risk for problems.  Ask your doctor about side  effects or reactions to medicines that you should watch for. Contact a doctor if you:  Think you are having a reaction to the medicine you are taking.  Have headaches that keep coming back (recurring).  Feel dizzy.  Have swelling in your ankles.  Have trouble with your vision. Get help right away if you:  Get a very bad headache.  Start to feel mixed up (confused).  Feel weak or numb.  Feel faint.  Have very bad pain in your: ? Chest. ? Belly (abdomen).  Throw up more than once.  Have trouble breathing. Summary  Hypertension is another name for high blood pressure.  High blood pressure forces your heart to work harder to pump  blood.  For most people, a normal blood pressure is less than 120/80.  Making healthy choices can help lower blood pressure. If your blood pressure does not get lower with healthy choices, you may need to take medicine. This information is not intended to replace advice given to you by your health care provider. Make sure you discuss any questions you have with your health care provider. Document Revised: 11/14/2017 Document Reviewed: 11/14/2017 Elsevier Patient Education  2020 [employer].

## 2019-04-14 NOTE — Progress Notes (Signed)
PATIENT: Timothy Santiago DOB: 29-Oct-1950  REASON FOR VISIT: follow up HISTORY FROM: patient  Chief Complaint  Patient presents with  . Follow-up    Yearly f/u. Alone. Rm 2. No new concerns at this time.      HISTORY OF PRESENT ILLNESS: Today 04/14/19 Timothy Santiago is a 69 y.o. male here today for follow up. He continues duloxetine 60mg  and bupropion 300mg  daily. He feels that depression is stable. He does admit that there are days where he feels better and days where he feels really down.  No suicidal ideations.  He was referred to psychiatry in September but states that he never heard anything back.  He feels that he is doing well on current regimen but does feel that formal evaluation with psychiatry is beneficial.  He feels that memory waxes and wanes.  There have been no concerning events since last being seen.  He does continue to have trouble recalling information.  He states a previous history of ADD.  He has not monitor blood pressures at home.  I have reviewed with him blood pressure reading from Dr. Garth Bigness visit last year as well as today's reading.  He denies chest pain or trouble breathing.  HISTORY: (copied from Dr Garth Bigness note on 04/16/2018)  I had the pleasure of seeing your patient, Timothy Santiago, at [facility] for neurologic consultation regarding his memory loss.  He is a 69 y.o. man who first noted difficulty with short term memory about a year ago.  His family has noted that he often repeats himself.   In general, he notes more difficulty with short term memory than his family or others.     He is working and travels a lot for his job.   He is in Press photographer (food products to schools).  He recalls recently filling up on gas to return his car rental and then turning into a gas station to fill up again.   He does feel that he is having more trouble on his job.    He has never been good at math but is not noting mor errors with the financial component of  his job.   He has to plan trips and talks.  He notes depression and also has attention deficit disorder.   He sees Dr. Alroy Dust.   The duloxetine was recently increased from 60 to 120 mg.   He also has had anxiety.   He notes most of his family has depression.    He has had trouble with mood issues x many years.   In the past, he had tried Ritalin but felt like he drank 30 cups of coffee so did not continue.   He feels very depressed but notes no suicidal ideation.    He has apathy -- no longer cleaning hte house and not doing activities he once enjoyed.    He has been drinking more, 2-4 drinks a day.    He feels it relaxes him.   His first depression occurred in his 80's.      He has some insomnia but averages 7 hours.   He snores but has not been noted to have pauses, snorts, gasps.   He sleeps alone, however, so only has a couple nights a year where his son is present at night.   He has never had a sleep study.      He denies significant medical illnesses.   No thyroid disease, known vitamin deficiency, DM.  He has no brain imaging.     His paternal grandmother had dementia at a late age.       Montreal Cognitive Assessment  04/16/2018  Visuospatial/ Executive (0/5) 3  Naming (0/3) 3  Attention: Read list of digits (0/2) 2  Attention: Read list of letters (0/1) 1  Attention: Serial 7 subtraction starting at 100 (0/3) 3  Language: Repeat phrase (0/2) 2  Language : Fluency (0/1) 0  Abstraction (0/2) 2  Delayed Recall (0/5) 2  Orientation (0/6) 6  Total 24  Adjusted Score (based on education) 24     EPWORTH SLEEPINESS SCALE  On a scale of 0 - 3 what is the chance of dozing:  Sitting and Reading:                                 0 Watching TV:                                            2 Sitting inactive in a public place:              1 Passenger in car for one hour:                 2 Lying down to rest in the afternoon:         0 Sitting and talking to someone:                 0 Sitting quietly after lunch:                         0 In a car, stopped in traffic:                        0  Total (out of 24):   5/24  Normal.    REVIEW OF SYSTEMS: Out of a complete 14 system review of symptoms, the patient complains only of the following symptoms, depression, anxiety, memory loss and all other reviewed systems are negative.  ALLERGIES: Allergies  Allergen Reactions  . Codeine     Upset stomach    HOME MEDICATIONS: Outpatient Medications Prior to Visit  Medication Sig Dispense Refill  . ALPRAZolam (XANAX) 0.25 MG tablet Take 0.5 mg by mouth at bedtime as needed for anxiety.    Marland Kitchen buPROPion (WELLBUTRIN XL) 300 MG 24 hr tablet Take 1 tablet (300 mg total) by mouth daily. 30 tablet 11  . DULoxetine (CYMBALTA) 60 MG capsule Take 1 capsule (60 mg total) by mouth daily. 60 capsule 5  . tamsulosin (FLOMAX) 0.4 MG CAPS capsule Take 0.8 mg by mouth daily.    . Vitamin D, Ergocalciferol, (DRISDOL) 1.25 MG (50000 UT) CAPS capsule Take 1 capsule (50,000 Units total) by mouth every 7 (seven) days. 13 capsule 3  . busPIRone (BUSPAR) 15 MG tablet Take 15 mg by mouth 2 (two) times daily.     No facility-administered medications prior to visit.    PAST MEDICAL HISTORY: Past Medical History:  Diagnosis Date  . Anxiety   . Depression   . Migraine     PAST SURGICAL HISTORY: Past Surgical History:  Procedure Laterality Date  . ANKLE SURGERY Left   . APPENDECTOMY    . SKIN CANCER EXCISION  FAMILY HISTORY: Family History  Problem Relation Age of Onset  . Depression Father   . Depression Sister   . Anxiety disorder Sister     SOCIAL HISTORY: Social History   Socioeconomic History  . Marital status: Single    Spouse name: Not on file  . Number of children: 2  . Years of education: 88  . Highest education level: Not on file  Occupational History  . Occupation: Investment banker, corporate  Tobacco Use  . Smoking status: Smoker, Current Status Timothy      Packs/day: 1.00  . Smokeless tobacco: Former Network engineer and Sexual Activity  . Alcohol use: Yes    Comment: 1-4 drinks per day  . Drug use: Never  . Sexual activity: Not on file  Other Topics Concern  . Not on file  Social History Narrative   Right handed    Caffeine use: coffee 2-3 x per week   Lives alone   Social Determinants of Health   Financial Resource Strain:   . Difficulty of Paying Living Expenses: Not on file  Food Insecurity:   . Worried About Charity fundraiser in the Last Year: Not on file  . Ran Out of Food in the Last Year: Not on file  Transportation Needs:   . Lack of Transportation (Medical): Not on file  . Lack of Transportation (Non-Medical): Not on file  Physical Activity:   . Days of Exercise per Week: Not on file  . Minutes of Exercise per Session: Not on file  Stress:   . Feeling of Stress : Not on file  Social Connections:   . Frequency of Communication with Friends and Family: Not on file  . Frequency of Social Gatherings with Friends and Family: Not on file  . Attends Religious Services: Not on file  . Active Member of Clubs or Organizations: Not on file  . Attends Archivist Meetings: Not on file  . Marital Status: Not on file  Intimate Partner Violence:   . Fear of Current or Ex-Partner: Not on file  . Emotionally Abused: Not on file  . Physically Abused: Not on file  . Sexually Abused: Not on file      PHYSICAL EXAM  Vitals:   04/14/19 1424  BP: (!) 168/93  Pulse: 87  Temp: (!) 97.1 F (36.2 C)  TempSrc: Oral  Weight: 243 lb 6.4 oz (110.4 kg)  Height: 6\' 4"  (1.93 m)   Body mass index is 29.63 kg/m.  Generalized: Well developed, in no acute distress  Cardiology: normal rate and rhythm, no murmur noted Respiratory: Clear to auscultation bilaterally Neurological examination  Mentation: Alert oriented to time, place, history taking. Follows all commands speech and language fluent Cranial nerve II-XII:  Pupils were equal round reactive to light. Extraocular movements were full, visual field were full on confrontational test. Facial sensation and strength were normal. Uvula tongue midline. Head turning and shoulder shrug  were normal and symmetric. Motor: The motor testing reveals 5 over 5 strength of all 4 extremities. Good symmetric motor tone is noted throughout.  Sensory: Sensory testing is intact to soft touch on all 4 extremities. No evidence of extinction is noted.  Coordination: Cerebellar testing reveals good finger-nose-finger and heel-to-shin bilaterally.  Gait and station: Gait is normal.    DIAGNOSTIC DATA (LABS, IMAGING, TESTING) - I reviewed patient records, labs, notes, testing and imaging myself where available.  No flowsheet data found.   No results found for: WBC, HGB,  HCT, MCV, PLT No results found for: NA, K, CL, CO2, GLUCOSE, BUN, CREATININE, CALCIUM, PROT, ALBUMIN, AST, ALT, ALKPHOS, BILITOT, GFRNONAA, GFRAA No results found for: CHOL, HDL, LDLCALC, LDLDIRECT, TRIG, CHOLHDL No results found for: HGBA1C Lab Results  Component Value Date   [id number] 04/16/2018   Lab Results  Component Value Date   TSH 2.410 04/16/2018       ASSESSMENT AND PLAN 69 y.o. year old male  has a past medical history of Anxiety, Depression, and Migraine. here with     ICD-10-CM   1. Severe episode of recurrent major depressive disorder, without psychotic features ([place])  F33.2 Ambulatory referral to Psychiatry  2. Memory loss  R41.3     Overall Adriel feels that symptoms are stable at this time.  He continues duloxetine and bupropion as prescribed.  He does feel that this is helped in with anxiety and depression.  We have discussed previous note from Dr. Felecia Shelling in regards to a psychiatry referral.  He does feel that this would be beneficial.  I have replaced order today.  He will continue current therapy until evaluated by psychiatry.  Once evaluated, he may follow-up with [place] as  needed.  Memory compensation strategies reviewed.  Repeat MoCA offered today but patient declines.  He does not feel that score would be any different.  We have discussed blood pressure readings in the office.  I have advised that he monitor these closely at home.  He should follow up with primary care if blood pressure readings greater than 140/90.  He is aware of red flag warnings and when to seek emergency medical attention.  He will follow up with [place] as needed once established with psychiatry.  He verbalizes understanding and agreement with this plan.   Orders Placed This Encounter  Procedures  . Ambulatory referral to Psychiatry    Referral Priority:   Routine    Referral Type:   Psychiatric    Referral Reason:   Specialty Services Required    Requested Specialty:   Psychiatry    Number of Visits Requested:   1     No orders of the defined types were placed in this encounter.     I spent 25 minutes with the patient. 50% of this time was spent counseling and educating patient on plan of care and medications.    Debbora Presto, FNP-C 04/14/2019, 4:20 PM [facility] [address], [place], [place] [address] [phone]

## 2019-05-16 ENCOUNTER — Other Ambulatory Visit: Payer: Self-pay | Admitting: Neurology

## 2019-05-16 ENCOUNTER — Telehealth: Payer: Self-pay | Admitting: Neurology

## 2019-05-16 MED ORDER — BUPROPION HCL ER (XL) 300 MG PO TB24: 300.0000 mg | ORAL_TABLET | Freq: Every day | ORAL | 1 refills | Status: DC

## 2019-05-16 NOTE — Telephone Encounter (Signed)
On-call physician was paged over the weekend, patient stating he was seen in the office 04/14/2019 and meds not refilled. I refilled for a month. I do see a referral to psychiatry but that usually take many months to get an appointment. Last refill over a year ago. I refilled for 30 days in case any other reason why it was not refilled, will send to provider.

## 2019-05-19 ENCOUNTER — Other Ambulatory Visit: Payer: Self-pay | Admitting: Family Medicine

## 2019-05-19 MED ORDER — DULOXETINE HCL 60 MG PO CPEP: 60.0000 mg | ORAL_CAPSULE | Freq: Every day | ORAL | 11 refills | Status: AC

## 2019-05-19 MED ORDER — BUPROPION HCL ER (XL) 300 MG PO TB24: 300.0000 mg | ORAL_TABLET | Freq: Every day | ORAL | 11 refills | Status: AC

## 2019-05-19 NOTE — Telephone Encounter (Signed)
I have reviewed chart and refilled medications appropriately. This must have been missed at appointment. Thanks for assisting patient. He is taken care of until next follow up.

## 2019-08-25 ENCOUNTER — Ambulatory Visit: Payer: Medicare Other | Admitting: Dermatology

## 2019-08-25 ENCOUNTER — Other Ambulatory Visit: Payer: Self-pay

## 2019-08-25 DIAGNOSIS — D485 Neoplasm of uncertain behavior of skin: Secondary | ICD-10-CM | POA: Diagnosis not present

## 2019-08-25 DIAGNOSIS — D229 Melanocytic nevi, unspecified: Secondary | ICD-10-CM

## 2019-08-25 DIAGNOSIS — Z85828 Personal history of other malignant neoplasm of skin: Secondary | ICD-10-CM

## 2019-08-25 NOTE — Patient Instructions (Signed)

## 2019-08-29 ENCOUNTER — Telehealth: Payer: Self-pay | Admitting: *Deleted

## 2019-08-29 NOTE — Telephone Encounter (Signed)
-----   Message from Lavonna Monarch, MD sent at 08/27/2019  9:22 PM EDT ----- Schedule surgery with Dr. Darene Lamer

## 2019-08-29 NOTE — Telephone Encounter (Signed)
Path to patient. He will call back to schedule his surgery.

## 2019-09-01 NOTE — Telephone Encounter (Signed)
-----   Message from Lavonna Monarch, MD sent at 08/27/2019  9:22 PM EDT ----- Schedule surgery with Dr. Darene Lamer

## 2019-09-01 NOTE — Telephone Encounter (Signed)
Phone call to see if patient to schedule him with Dr. Denna Haggard for a surgery.  Patient states he'll call back some time next week and schedule an appointment.

## 2019-09-06 ENCOUNTER — Encounter: Payer: Self-pay | Admitting: Dermatology

## 2019-09-06 NOTE — Progress Notes (Signed)
   Follow-Up Visit   Subjective  Timothy Santiago is a 69 y.o. male who presents for the following: Skin Problem (Check several places lower legs not going away. Also check top right ear very sore and one below it. ).  Growths Location: Leg, arms, ear Duration:  Quality:  Associated Signs/Symptoms: Modifying Factors:  Severity:  Timing: Context: History of multiple skin cancers  The following portions of the chart were reviewed this encounter and updated as appropriate: Tobacco  Allergies  Meds  Problems  Med Hx  Surg Hx  Fam Hx      Objective  Well appearing patient in no apparent distress; mood and affect are within normal limits.  All skin waist up examined.  Plus legs.   Assessment & Plan  Neoplasm of uncertain behavior of skin (4) Right Lower Leg superior  Skin / nail biopsy Type of biopsy: tangential   Informed consent: discussed and consent obtained   Timeout: patient name, date of birth, surgical site, and procedure verified   Anesthesia: the lesion was anesthetized in a standard fashion   Anesthetic:  1% lidocaine w/ epinephrine 1-100,000 local infiltration Instrument used: flexible razor blade   Hemostasis achieved with: ferric subsulfate   Outcome: patient tolerated procedure well   Post-procedure details: wound care instructions given    Specimen 1 - Surgical pathology Differential Diagnosis: scc vs bcc Check Margins: No  Left Hand - Posterior  Skin / nail biopsy Type of biopsy: tangential   Informed consent: discussed and consent obtained   Timeout: patient name, date of birth, surgical site, and procedure verified   Anesthesia: the lesion was anesthetized in a standard fashion   Anesthetic:  1% lidocaine w/ epinephrine 1-100,000 local infiltration Instrument used: flexible razor blade   Hemostasis achieved with: ferric subsulfate   Outcome: patient tolerated procedure well   Post-procedure details: wound care instructions given    Specimen 2  - Surgical pathology Differential Diagnosis: scc vs bcc Check Margins: No  Right Superior Crus of Antihelix  Skin / nail biopsy Type of biopsy: tangential   Informed consent: discussed and consent obtained   Timeout: patient name, date of birth, surgical site, and procedure verified   Anesthesia: the lesion was anesthetized in a standard fashion   Anesthetic:  1% lidocaine w/ epinephrine 1-100,000 local infiltration Instrument used: flexible razor blade   Hemostasis achieved with: ferric subsulfate   Outcome: patient tolerated procedure well   Post-procedure details: wound care instructions given    Specimen 3 - Surgical pathology Differential Diagnosis: scc vs bcc Check Margins: No  Right Hand - Posterior  Skin / nail biopsy Type of biopsy: tangential   Informed consent: discussed and consent obtained   Timeout: patient name, date of birth, surgical site, and procedure verified   Anesthesia: the lesion was anesthetized in a standard fashion   Anesthetic:  1% lidocaine w/ epinephrine 1-100,000 local infiltration Instrument used: flexible razor blade   Hemostasis achieved with: ferric subsulfate   Outcome: patient tolerated procedure well   Post-procedure details: wound care instructions given    Specimen 4 - Surgical pathology Differential Diagnosis: scc vs bcc Check Margins: No

## 2019-10-08 IMAGING — CT CT ABD-PELV W/ CM
1 of 3 series · 13 of 32 positions shown, 19 images · IV contrast (APPLIED)
Comparison: None.

CLINICAL DATA: Diffuse abdominal pain.

EXAM:
CT ABDOMEN AND PELVIS WITH CONTRAST
TECHNIQUE: Multidetector CT imaging of the abdomen and pelvis was performed
using the standard protocol following bolus administration of
intravenous contrast.
CONTRAST:  125mL 5OQC6X-GMM IOPAMIDOL (5OQC6X-GMM) INJECTION 61%

[Series 2: abd/pelvis w/cm · axial · 0.98mm/px · z∈[-532,-112]mm · 13 of 99 slices shown, 19 images]
[im 8/99  soft-tissue]
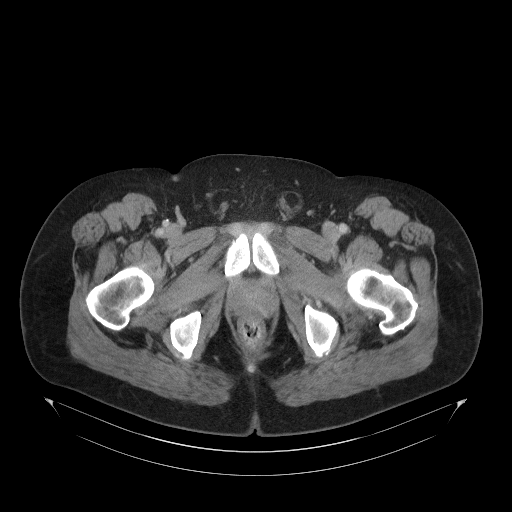
[im 8/99  bone]
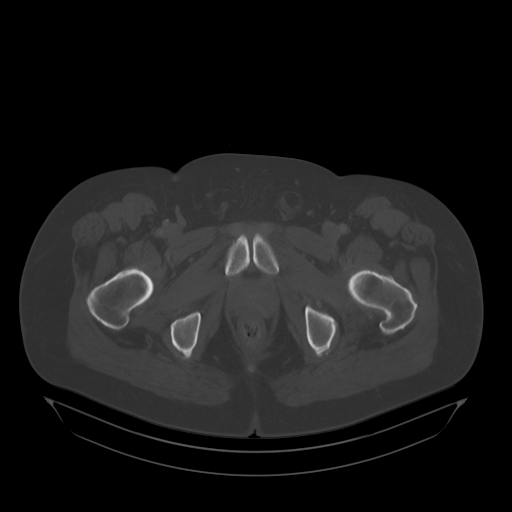
[im 15/99  soft-tissue]
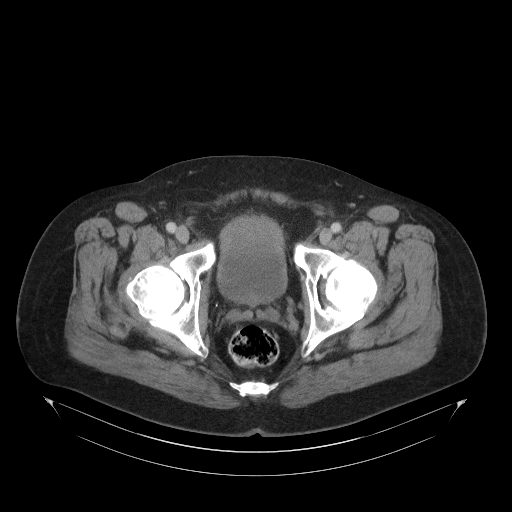
[im 22/99  soft-tissue]
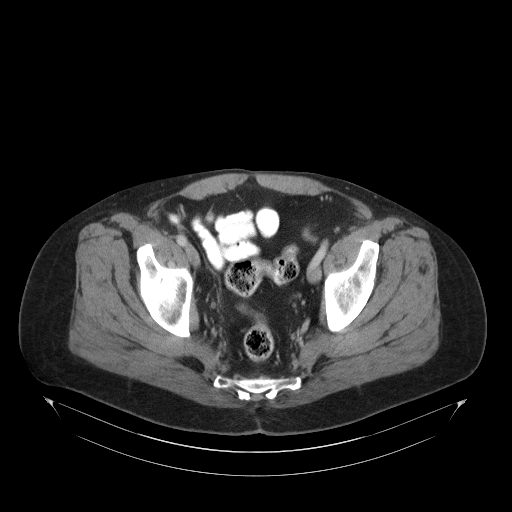
[im 29/99  soft-tissue]
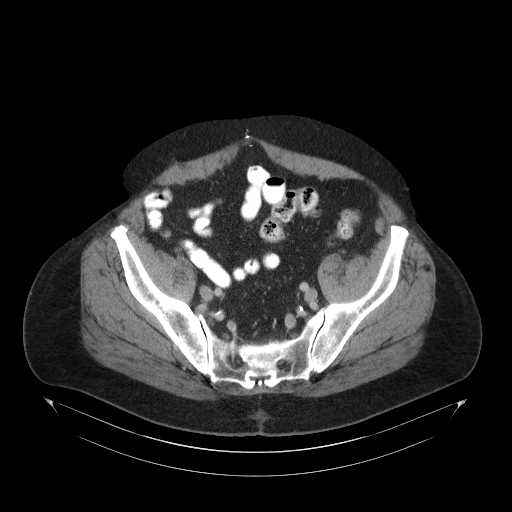
[im 36/99  soft-tissue]
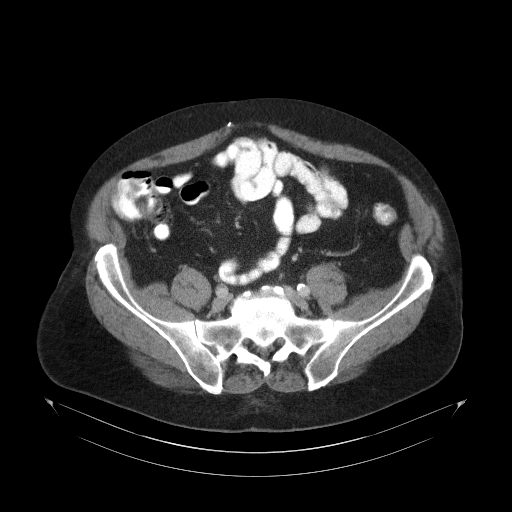
[im 43/99  soft-tissue]
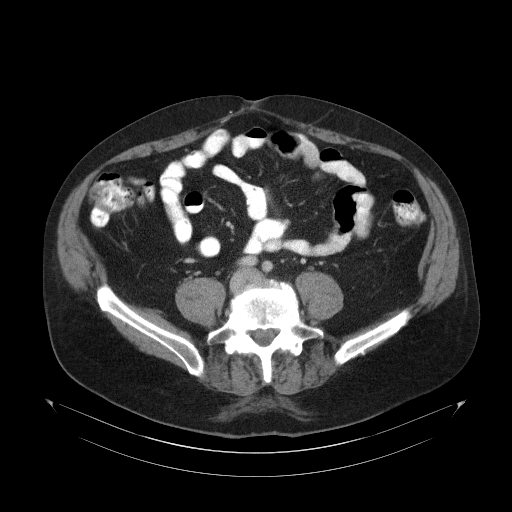
[im 50/99  soft-tissue]
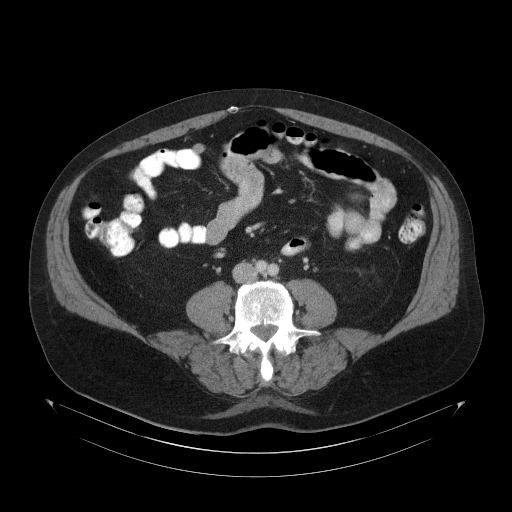
[im 57/99  soft-tissue]
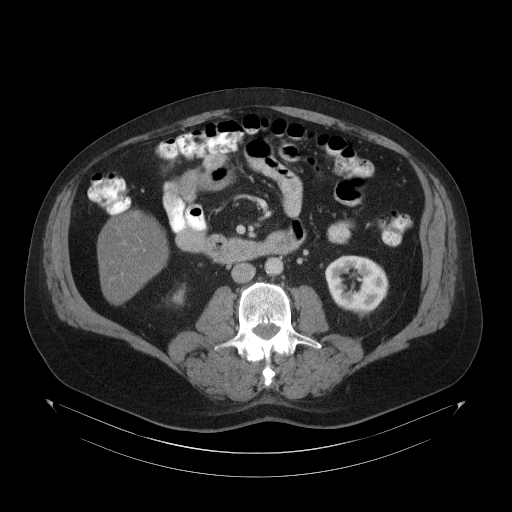
[im 64/99  soft-tissue]
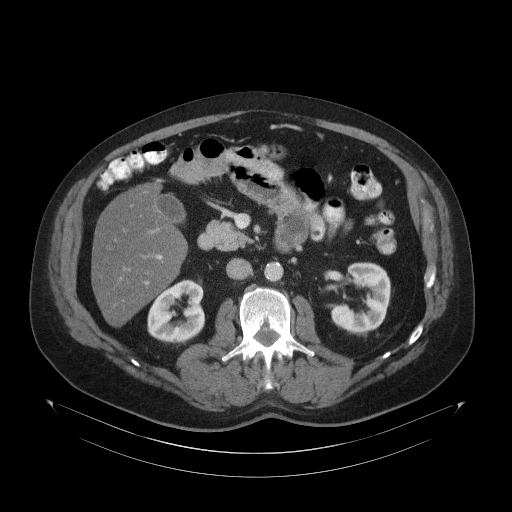
[im 64/99  bone]
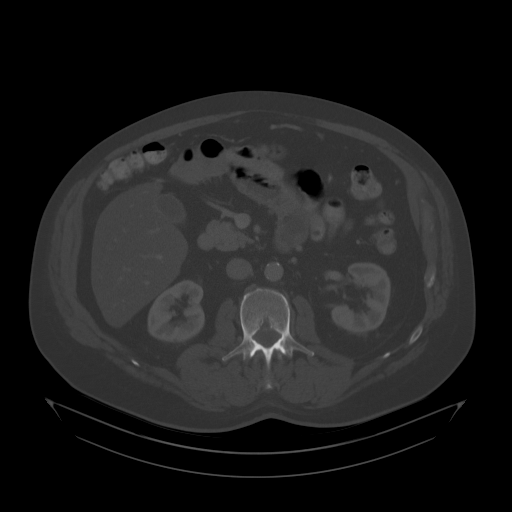
[im 71/99  soft-tissue]
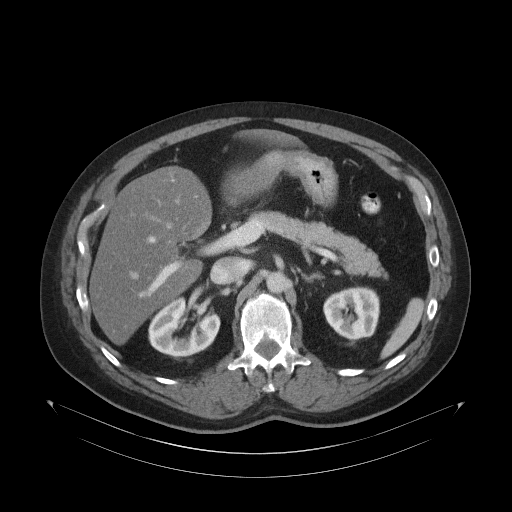
[im 71/99  lung]
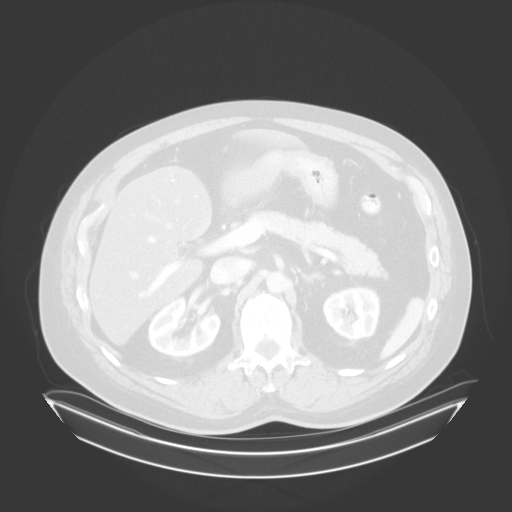
[im 78/99  soft-tissue]
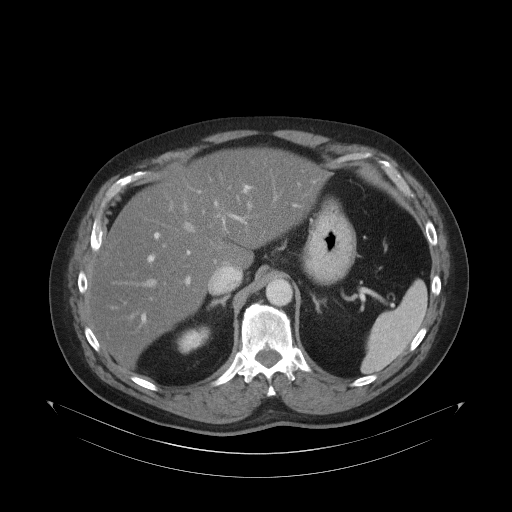
[im 78/99  lung]
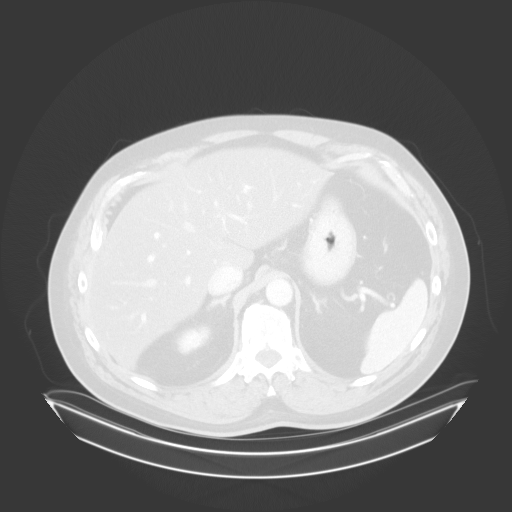
[im 85/99  soft-tissue]
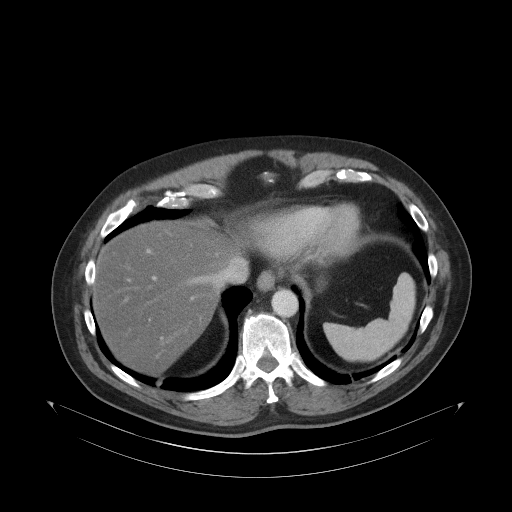
[im 85/99  lung]
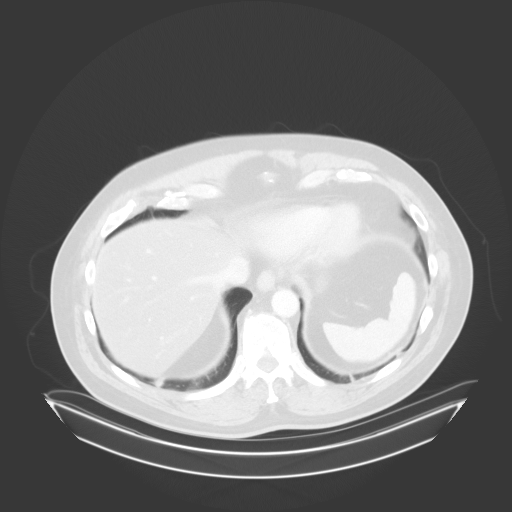
[im 92/99  soft-tissue]
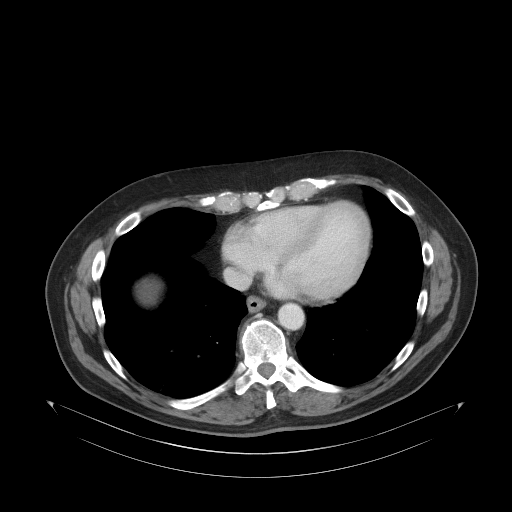
[im 92/99  lung]
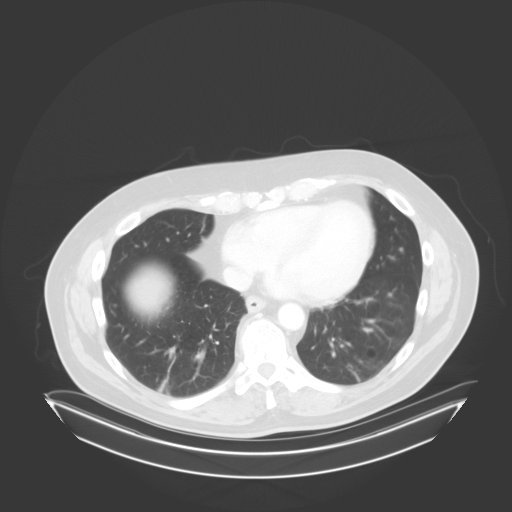

[13 of 32 positions shown; findings below may reference images not displayed]

FINDINGS: Lower Chest: No acute findings.

Hepatobiliary: No hepatic masses identified. Severe diffuse hepatic
steatosis. Gallbladder is unremarkable. No evidence of biliary
ductal dilatation.

Pancreas:  No mass or inflammatory changes.

Spleen: Within normal limits in size and appearance.

Adrenals/Urinary Tract: No masses identified. No evidence of
hydronephrosis.

Stomach/Bowel: No evidence of obstruction, inflammatory process or
abnormal fluid collections.

Vascular/Lymphatic: No pathologically enlarged lymph nodes. No
abdominal aortic aneurysm. Aortic atherosclerosis.

Reproductive:  No mass or other significant abnormality.

Other:  None.

Musculoskeletal:  No suspicious bone lesions identified.
IMPRESSION: Severe hepatic steatosis. No other significant abnormality
identified.

## 2019-11-21 ENCOUNTER — Other Ambulatory Visit: Payer: Self-pay

## 2019-11-21 ENCOUNTER — Ambulatory Visit: Payer: Medicare Other | Admitting: Interventional Cardiology

## 2019-11-21 VITALS — Ht 76.0 in | Wt 220.0 lb

## 2019-11-21 DIAGNOSIS — Z789 Other specified health status: Secondary | ICD-10-CM

## 2019-11-21 DIAGNOSIS — R55 Syncope and collapse: Secondary | ICD-10-CM

## 2019-11-21 DIAGNOSIS — R030 Elevated blood-pressure reading, without diagnosis of hypertension: Secondary | ICD-10-CM

## 2019-11-21 DIAGNOSIS — Z7289 Other problems related to lifestyle: Secondary | ICD-10-CM | POA: Diagnosis not present

## 2019-11-21 DIAGNOSIS — F1090 Alcohol use, unspecified, uncomplicated: Secondary | ICD-10-CM

## 2019-11-21 NOTE — Progress Notes (Signed)
Cardiology Office Note   Date:  11/21/2019   ID:  Timothy Santiago, DOB October 22, 1950, MRN [id number]  PCP:  Aurea Graff.Marlou Sa, MD    No chief complaint on file.  Syncope  Wt Readings from Last 3 Encounters:  11/21/19 220 lb (99.8 kg)  04/14/19 243 lb 6.4 oz (110.4 kg)  04/16/18 270 lb (122.5 kg)       History of Present Illness: Timothy Santiago is a 69 y.o. male who is being seen today for the evaluation of syncope at the request of Alroy Dust, L.Marlou Sa, MD.  He has had syncope as far back as his 20's.  He had some syncope when standing in one place.  In July 2021, he had an episode when he stood up and then started walking and passed out while walking.  A few weeks ago, he stood up and was walking to the kitchen.  He woke up on the floor.    Heat seems to make things worse.   He does not check BP at home. He started tamsulosin prior to these episodes of fainting.  He does not notice any improvement in urinary frequency.    He also reports that he has been drinking more since retiring recently.  Denies : Chest pain. Dizziness. Leg edema. Nitroglycerin use. Orthopnea. Palpitations. Paroxysmal nocturnal dyspnea. Shortness of breath.     Past Medical History:  Diagnosis Date  . Anxiety   . BCC (basal cell carcinoma of skin) 08/12/2013   Right Chest (curet, 5FU, and excision)  . BCC (basal cell carcinoma of skin) 08/12/2013   Left [facility])  . BCC (basal cell carcinoma of skin) 10/30/2013   Right Chest (+ margin)  . BPH (benign prostatic hyperplasia)   . Cluster headache   . Depression   . ED (erectile dysfunction)   . Fatty liver   . Hypercholesterolemia   . Low back pain   . Memory deficit   . Migraine   . Mild major depression ([place])   . 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 Poor appetite   . SCCA (squamous cell carcinoma) of skin 08/12/2013   [facility])  . SCCA (squamous cell carcinoma) of skin  03/23/2016   Right Back Shoulder (in situ) (tx p bx)  . SCCA (squamous cell carcinoma) of skin 05/14/2017   Left Shoulder (well diff) (tx p bx)    Past Surgical History:  Procedure Laterality Date  . ANKLE SURGERY Left   . APPENDECTOMY    . SKIN CANCER EXCISION       Current Outpatient Medications  Medication Sig Dispense Refill  . ALPRAZolam (XANAX) 0.25 MG tablet Take 0.5 mg by mouth at bedtime as needed for anxiety.    Marland Kitchen buPROPion (WELLBUTRIN XL) 300 MG 24 hr tablet Take 1 tablet (300 mg total) by mouth daily. 30 tablet 11  . busPIRone (BUSPAR) 30 MG tablet Take 30 mg by mouth 2 (two) times daily.    . DULoxetine (CYMBALTA) 60 MG capsule Take 1 capsule (60 mg total) by mouth daily. 60 capsule 11  . HYDROcodone-acetaminophen (NORCO/VICODIN) 5-325 MG tablet Take 1 tablet by mouth every 6 (six) hours as needed for moderate pain.    Marland Kitchen ibuprofen (ADVIL) 200 MG tablet Take 200 mg by mouth every 6 (six) hours as needed.    . sildenafil (REVATIO) 20 MG tablet Take 20 mg by mouth as needed.    . SUMAtriptan (  IMITREX) 100 MG tablet Take 100 mg by mouth every 2 (two) hours as needed for migraine. May repeat in 2 hours if headache persists or recurs.    . Vitamin D, Ergocalciferol, (DRISDOL) 1.25 MG (50000 UT) CAPS capsule Take 1 capsule (50,000 Units total) by mouth every 7 (seven) days. 13 capsule 3   No current facility-administered medications for this visit.    Allergies:   Codeine    Social History:  The patient  reports that he has been smoking. He has been smoking about 1.00 pack per day. He has quit using smokeless tobacco. He reports current alcohol use. He reports that he does not use drugs.   Family History:  The patient's family history includes Anxiety disorder in his sister; Depression in his father and sister.    ROS:  Please see the history of present illness.   Otherwise, review of systems are positive for syncope.   All other systems are reviewed and negative.     PHYSICAL EXAM: VS:  Ht 6\' 4"  (1.93 m)   Wt 220 lb (99.8 kg)   SpO2 99%   BMI 26.78 kg/m  , BMI Body mass index is 26.78 kg/m. GEN: Well nourished, well developed, in no acute distress  HEENT: normal  Neck: no JVD, carotid bruits, or masses Cardiac: RRR, premature beats frequently; no murmurs, rubs, or gallops,no edema  Respiratory:  clear to auscultation bilaterally, normal work of breathing GI: soft, nontender, nondistended, + BS MS: no deformity or atrophy  Skin: warm and dry, no rash,rudy complexion Neuro:  Strength and sensation are intact Psych: euthymic mood, full affect  Standing 153/100, HR 108; sitting 187/95, HR 51, Lying down 191/100 HR 49 EKG:   The ekg ordered today demonstrates atrial bigeminy, no ST changes   Recent Labs: No results found for requested labs within last 8760 hours.   Lipid Panel No results found for: CHOL, TRIG, HDL, CHOLHDL, VLDL, LDLCALC, LDLDIRECT   Other studies Reviewed: Additional studies/ records that were reviewed today with results demonstrating: PMD records reviewed; Cr 1.13.   ASSESSMENT AND PLAN:  1. Syncope: Unclear etiology. Episodes occur when he starts walking after standing. Several factors including dehydration, meds and alcohol may be factoring in.  Stop Flomax as this med can cause dizziness and has not helped urinary frequency.  Stay hydrated.  Decrease alcohol intake to 1 glass of red wine daily for heart health.  Limit caffeine to 1 cup per day. 2. Elevated BP and high HR variability with change in position.  3. Plan for echo and monitor to look for arrhythmia that could cause syncope.    Current medicines are reviewed at length with the patient today.  The patient concerns regarding his medicines were addressed.  The following changes have been made:  As above  Labs/ tests ordered today include:   Orders Placed This Encounter  Procedures  . CARDIAC EVENT MONITOR  . ECHOCARDIOGRAM COMPLETE    Recommend 150  minutes/week of aerobic exercise Low fat, low carb, high fiber diet recommended  Disposition:   FU in 4-6 weeks   Signed, Larae Grooms, MD  11/21/2019 5:53 PM    [facility] [place], [place],   [address] Phone: [phone]; Fax: [phone]

## 2019-11-21 NOTE — Patient Instructions (Addendum)
Medication Instructions:  Your physician has recommended you make the following change in your medication:   STOP: tamsulosin (flomax)  *If you need a refill on your cardiac medications before your next appointment, please call your pharmacy*   Lab Work: None  If you have labs (blood work) drawn today and your tests are completely normal, you will receive your results only by: Marland Kitchen MyChart Message (if you have MyChart) OR . A paper copy in the mail If you have any lab test that is abnormal or we need to change your treatment, we will call you to review the results.   Testing/Procedures: Your physician has requested that you have an echocardiogram. Echocardiography is a painless test that uses sound waves to create images of your heart. It provides your doctor with information about the size and shape of your heart and how well your heart's chambers and valves are working. This procedure takes approximately one hour. There are no restrictions for this procedure.  Your physician has recommended that you wear an event monitor for 14 days. Event monitors are medical devices that record the heart's electrical activity. Doctors most often [place] these monitors to diagnose arrhythmias. Arrhythmias are problems with the speed or rhythm of the heartbeat. The monitor is a small, portable device. You can wear one while you do your normal daily activities. This is usually used to diagnose what is causing palpitations/syncope (passing out).   Follow-Up: At [facility], you and your health needs are our priority.  As part of our continuing mission to provide you with exceptional heart care, we have created designated Provider Care Teams.  These Care Teams include your primary Cardiologist (physician) and Advanced Practice Providers (APPs -  Physician Assistants and Nurse Practitioners) who all work together to provide you with the care you need, when you need it.  We recommend signing up for the patient  portal called "MyChart".  Sign up information is provided on this After Visit Summary.  MyChart is used to connect with patients for Virtual Visits (Telemedicine).  Patients are able to view lab/test results, encounter notes, upcoming appointments, etc.  Non-urgent messages can be sent to your provider as well.   To learn more about what you can do with MyChart, go to [email or web address].    Your next appointment:   4-6 week(s)  The format for your next appointment:   In Person  Provider:   You may see Larae Grooms, MD or one of the following Advanced Practice Providers on your designated Care Team:    Melina Copa, PA-C  Ermalinda Barrios, PA-C    Other Instructions Your physician has requested that you regularly monitor and record your blood pressure readings at home. Please use the same machine at the same time of day to check your readings and record them. We recommend an [employer] Blood Pressure Machine.  Decrease alcohol intake.   [employer] Cardiac Event Monitor Instructions Your physician has requested you wear your cardiac event monitor for 14 days. [employer] may call or text to confirm a shipping address. The monitor will be sent to a land address via UPS. [employer] will not ship a monitor to a PO BOX. It typically takes 3-5 days to receive your monitor after it has been enrolled. [employer] will assist with USPS tracking if your package is delayed. The telephone number for [employer] is [phone]. Once you have received your monitor, please review the enclosed instructions. Instruction tutorials can also be viewed under help and settings on the enclosed  cell phone. Your monitor has already been registered assigning a specific monitor serial # to you.  Applying the monitor Remove cell phone from case and turn it on. The cell phone works as Dealer and needs to be within [employer] of you at all times. The cell phone will need to be charged on a daily basis.  We recommend you plug the cell phone into the enclosed charger at your bedside table every night.  Monitor batteries: You will receive two monitor batteries labelled #1 and #2. These are your recorders. Plug battery #2 onto the second connection on the enclosed charger. Keep one battery on the charger at all times. This will keep the monitor battery deactivated. It will also keep it fully charged for when you need to switch your monitor batteries. A small light will be blinking on the battery emblem when it is charging. The light on the battery emblem will remain on when the battery is fully charged.  Open package of a Monitor strip. Insert battery #1 into black hood on strip and gently squeeze monitor battery onto connection as indicated in instruction booklet. Set aside while preparing skin.  Choose location for your strip, vertical or horizontal, as indicated in the instruction booklet. Shave to remove all hair from location. There cannot be any lotions, oils, powders, or colognes on skin where monitor is to be applied. Wipe skin clean with enclosed Saline wipe. Dry skin completely.  Peel paper labeled #1 off the back of the Monitor strip exposing the adhesive. Place the monitor on the chest in the vertical or horizontal position shown in the instruction booklet. One arrow on the monitor strip must be pointing upward. Carefully remove paper labeled #2, attaching remainder of strip to your skin. Try not to create any folds or wrinkles in the strip as you apply it.  Firmly press and release the circle in the center of the monitor battery. You will hear a small beep. This is turning the monitor battery on. The heart emblem on the monitor battery will light up every 5 seconds if the monitor battery in turned on and connected to the patient securely. Do not push and hold the circle down as this turns the monitor battery off. The cell phone will locate the monitor battery. A screen will appear  on the cell phone checking the connection of your monitor strip. This may read poor connection initially but change to good connection within the next minute. Once your monitor accepts the connection you will hear a series of 3 beeps followed by a climbing crescendo of beeps. A screen will appear on the cell phone showing the two monitor strip placement options. Touch the picture that demonstrates where you applied the monitor strip.  Your monitor strip and battery are waterproof. You are able to shower, bathe, or swim with the monitor on. They just ask you do not submerge deeper than 3 feet underwater. We recommend removing the monitor if you are swimming in a lake, river, or ocean.  Your monitor battery will need to be switched to a fully charged monitor battery approximately once a week. The cell phone will alert you of an action which needs to be made.  On the cell phone, tap for details to reveal connection status, monitor battery status, and cell phone battery status. The green dots indicates your monitor is in good status. A red dot indicates there is something that needs your attention.  To record a symptom, click the circle  on the monitor battery. In 30-60 seconds a list of symptoms will appear on the cell phone. Select your symptom and tap save. Your monitor will record a sustained or significant arrhythmia regardless of you clicking the button. Some patients do not feel the heart rhythm irregularities. [employer] will notify us of any serious or critical events.  Refer to instruction booklet for instructions on switching batteries, changing strips, the Do not disturb or Pause features, or any additional questions.  Call [employer] at [phone], to confirm your monitor is transmitting and record your baseline. They will answer any questions you may have regarding the monitor instructions at that time.  Returning the monitor to [place] all equipment back into blue  box. Peel off strip of paper to expose adhesive and close box securely. There is a prepaid UPS shipping label on this box. Drop in a UPS drop box, or at a UPS facility like [employer]. You may also contact [employer] to arrange UPS to pick up monitor package at your home.

## 2019-11-25 ENCOUNTER — Telehealth: Payer: Self-pay | Admitting: Radiology

## 2019-11-25 NOTE — Telephone Encounter (Signed)
Enrolled patient for a 14 day [employer] Event Monitor to be mailed to patients home.  

## 2019-12-09 ENCOUNTER — Encounter (HOSPITAL_COMMUNITY): Payer: Self-pay | Admitting: Interventional Cardiology

## 2019-12-09 ENCOUNTER — Other Ambulatory Visit (HOSPITAL_COMMUNITY): Payer: Medicare Other

## 2019-12-19 ENCOUNTER — Telehealth (HOSPITAL_COMMUNITY): Payer: Self-pay | Admitting: Interventional Cardiology

## 2019-12-19 NOTE — Telephone Encounter (Signed)
Just an FYI. We have made several attempts to contact this patient including sending a letter to schedule or reschedule their echocardiogram. We will be removing the patient from the echo WQ.   MAILED LETTER [facility]  12/09/19 PT NO SHOWED   Thank you

## 2019-12-22 ENCOUNTER — Other Ambulatory Visit: Payer: Self-pay

## 2019-12-22 ENCOUNTER — Telehealth: Payer: Self-pay | Admitting: Interventional Cardiology

## 2019-12-22 ENCOUNTER — Ambulatory Visit (INDEPENDENT_AMBULATORY_CARE_PROVIDER_SITE_OTHER): Payer: Medicare Other

## 2019-12-22 ENCOUNTER — Telehealth (HOSPITAL_COMMUNITY): Payer: Self-pay | Admitting: Interventional Cardiology

## 2019-12-22 DIAGNOSIS — R55 Syncope and collapse: Secondary | ICD-10-CM

## 2019-12-22 NOTE — Telephone Encounter (Signed)
Patient states he was on the phone with someone, requesting to schedule an appointment to have a nurse assist with applying his monitor when the call was disconnected. Patient is not sure who he was speaking with. Please assist.

## 2019-12-22 NOTE — Telephone Encounter (Signed)
Patient scheduled to come into office 12/22/2019, 3:30PM, to have monitor applied.

## 2019-12-22 NOTE — Telephone Encounter (Signed)
Just an FYI. We have made several attempts to contact this patient including sending a letter to schedule or reschedule their echocardiogram. We will be removing the patient from the echo WQ.   MAILED LETTER LBW  12/09/19 PT NO SHOWED    Thank you

## 2019-12-31 ENCOUNTER — Ambulatory Visit: Payer: Medicare Other | Admitting: Interventional Cardiology

## 2020-01-07 ENCOUNTER — Other Ambulatory Visit: Payer: Self-pay

## 2020-01-07 ENCOUNTER — Ambulatory Visit (HOSPITAL_COMMUNITY): Payer: Medicare Other | Attending: Cardiology

## 2020-01-07 DIAGNOSIS — R55 Syncope and collapse: Secondary | ICD-10-CM

## 2020-01-07 LAB — ECHOCARDIOGRAM COMPLETE
Area-P 1/2: 4.53 cm2
S' Lateral: 2.65 cm

## 2020-01-09 ENCOUNTER — Telehealth: Payer: Self-pay | Admitting: Interventional Cardiology

## 2020-01-09 ENCOUNTER — Telehealth: Payer: Self-pay

## 2020-01-09 NOTE — Telephone Encounter (Signed)
Results given to pt per MD note. Pt verbalized understanding; no further questions/concerns.

## 2020-01-09 NOTE — Telephone Encounter (Signed)
I spoke with patient who confirms he received echo results and has no questions.

## 2020-01-09 NOTE — Telephone Encounter (Signed)
    Pt is returning Timothy Santiago's call. He said he already got a call from another nurse and got his echo result

## 2020-01-16 ENCOUNTER — Ambulatory Visit: Payer: Medicare Other | Admitting: Interventional Cardiology

## 2020-07-21 DIAGNOSIS — C44222 Squamous cell carcinoma of skin of right ear and external auricular canal: Secondary | ICD-10-CM | POA: Diagnosis not present

## 2020-07-21 DIAGNOSIS — D2271 Melanocytic nevi of right lower limb, including hip: Secondary | ICD-10-CM | POA: Diagnosis not present

## 2020-07-21 DIAGNOSIS — D492 Neoplasm of unspecified behavior of bone, soft tissue, and skin: Secondary | ICD-10-CM | POA: Diagnosis not present

## 2020-07-21 DIAGNOSIS — L821 Other seborrheic keratosis: Secondary | ICD-10-CM | POA: Diagnosis not present

## 2020-07-21 DIAGNOSIS — C44622 Squamous cell carcinoma of skin of right upper limb, including shoulder: Secondary | ICD-10-CM | POA: Diagnosis not present

## 2020-07-21 DIAGNOSIS — D225 Melanocytic nevi of trunk: Secondary | ICD-10-CM | POA: Diagnosis not present

## 2020-07-21 DIAGNOSIS — C44529 Squamous cell carcinoma of skin of other part of trunk: Secondary | ICD-10-CM | POA: Diagnosis not present

## 2020-07-21 DIAGNOSIS — C44519 Basal cell carcinoma of skin of other part of trunk: Secondary | ICD-10-CM | POA: Diagnosis not present

## 2020-07-21 DIAGNOSIS — D2272 Melanocytic nevi of left lower limb, including hip: Secondary | ICD-10-CM | POA: Diagnosis not present

## 2020-09-03 DIAGNOSIS — D492 Neoplasm of unspecified behavior of bone, soft tissue, and skin: Secondary | ICD-10-CM | POA: Diagnosis not present

## 2020-09-03 DIAGNOSIS — L570 Actinic keratosis: Secondary | ICD-10-CM | POA: Diagnosis not present

## 2020-09-03 DIAGNOSIS — C44622 Squamous cell carcinoma of skin of right upper limb, including shoulder: Secondary | ICD-10-CM | POA: Diagnosis not present

## 2020-09-03 DIAGNOSIS — C44222 Squamous cell carcinoma of skin of right ear and external auricular canal: Secondary | ICD-10-CM | POA: Diagnosis not present

## 2020-09-27 DIAGNOSIS — L570 Actinic keratosis: Secondary | ICD-10-CM | POA: Diagnosis not present

## 2020-09-27 DIAGNOSIS — Z85828 Personal history of other malignant neoplasm of skin: Secondary | ICD-10-CM | POA: Diagnosis not present

## 2020-09-27 DIAGNOSIS — Z481 Encounter for planned postprocedural wound closure: Secondary | ICD-10-CM | POA: Diagnosis not present

## 2020-09-27 DIAGNOSIS — S01301A Unspecified open wound of right ear, initial encounter: Secondary | ICD-10-CM | POA: Diagnosis not present

## 2020-10-07 DIAGNOSIS — D492 Neoplasm of unspecified behavior of bone, soft tissue, and skin: Secondary | ICD-10-CM | POA: Diagnosis not present

## 2020-10-07 DIAGNOSIS — C44519 Basal cell carcinoma of skin of other part of trunk: Secondary | ICD-10-CM | POA: Diagnosis not present

## 2020-10-07 DIAGNOSIS — C44712 Basal cell carcinoma of skin of right lower limb, including hip: Secondary | ICD-10-CM | POA: Diagnosis not present

## 2020-10-07 DIAGNOSIS — L309 Dermatitis, unspecified: Secondary | ICD-10-CM | POA: Diagnosis not present

## 2020-10-07 DIAGNOSIS — C44529 Squamous cell carcinoma of skin of other part of trunk: Secondary | ICD-10-CM | POA: Diagnosis not present

## 2020-10-07 DIAGNOSIS — C4402 Squamous cell carcinoma of skin of lip: Secondary | ICD-10-CM | POA: Diagnosis not present

## 2020-10-18 DIAGNOSIS — Z48817 Encounter for surgical aftercare following surgery on the skin and subcutaneous tissue: Secondary | ICD-10-CM | POA: Diagnosis not present

## 2020-11-12 DIAGNOSIS — D045 Carcinoma in situ of skin of trunk: Secondary | ICD-10-CM | POA: Diagnosis not present

## 2020-11-12 DIAGNOSIS — D492 Neoplasm of unspecified behavior of bone, soft tissue, and skin: Secondary | ICD-10-CM | POA: Diagnosis not present

## 2020-11-25 DIAGNOSIS — C001 Malignant neoplasm of external lower lip: Secondary | ICD-10-CM | POA: Diagnosis not present

## 2021-01-27 DIAGNOSIS — Z136 Encounter for screening for cardiovascular disorders: Secondary | ICD-10-CM | POA: Diagnosis not present

## 2021-01-27 DIAGNOSIS — Z0000 Encounter for general adult medical examination without abnormal findings: Secondary | ICD-10-CM | POA: Diagnosis not present

## 2021-01-27 DIAGNOSIS — M545 Low back pain, unspecified: Secondary | ICD-10-CM | POA: Diagnosis not present

## 2021-01-27 DIAGNOSIS — Z23 Encounter for immunization: Secondary | ICD-10-CM | POA: Diagnosis not present

## 2021-01-27 DIAGNOSIS — Z720 Tobacco use: Secondary | ICD-10-CM | POA: Diagnosis not present

## 2021-01-27 DIAGNOSIS — Z13220 Encounter for screening for lipoid disorders: Secondary | ICD-10-CM | POA: Diagnosis not present

## 2021-01-27 DIAGNOSIS — K760 Fatty (change of) liver, not elsewhere classified: Secondary | ICD-10-CM | POA: Diagnosis not present

## 2021-01-27 DIAGNOSIS — Z1211 Encounter for screening for malignant neoplasm of colon: Secondary | ICD-10-CM | POA: Diagnosis not present

## 2021-02-18 DIAGNOSIS — Z87891 Personal history of nicotine dependence: Secondary | ICD-10-CM | POA: Diagnosis not present

## 2021-02-18 DIAGNOSIS — Z136 Encounter for screening for cardiovascular disorders: Secondary | ICD-10-CM | POA: Diagnosis not present

## 2021-03-30 ENCOUNTER — Other Ambulatory Visit: Payer: Self-pay | Admitting: *Deleted

## 2021-03-30 DIAGNOSIS — Z87891 Personal history of nicotine dependence: Secondary | ICD-10-CM

## 2021-03-30 DIAGNOSIS — F17210 Nicotine dependence, cigarettes, uncomplicated: Secondary | ICD-10-CM

## 2021-04-25 DIAGNOSIS — D045 Carcinoma in situ of skin of trunk: Secondary | ICD-10-CM | POA: Diagnosis not present

## 2021-04-27 ENCOUNTER — Encounter: Payer: Medicare HMO | Admitting: Acute Care

## 2021-04-27 ENCOUNTER — Other Ambulatory Visit: Payer: Self-pay

## 2021-04-27 NOTE — Progress Notes (Deleted)
Virtual Visit via Telephone Note  I connected with Charyl Dancer on 04/27/21 at  9:30 AM EST by telephone and verified that I am speaking with the correct person using two identifiers.  Location: Patient:  At home Provider: [place], [place], [place], [address]    I discussed the limitations, risks, security and privacy concerns of performing an evaluation and management service by telephone and the availability of in person appointments. I also discussed with the patient that there may be a patient responsible charge related to this service. The patient expressed understanding and agreed to proceed.    Shared Decision Making Visit Lung Cancer Screening Program [phone])   Eligibility: Age 71 y.o. Pack Years Smoking History Calculation *** (# packs/per year x # years smoked) Recent History of coughing up blood  {YES NO:22349} Unexplained weight loss? {YES NO:22349} ( >Than 15 pounds within the last 6 months ) Prior History Lung / other cancer {YES NO:22349} (Diagnosis within the last 5 years already requiring surveillance chest CT Scans). Smoking Status {Smoking Status:21012044} Former Smokers: Years since quit: {Smoking numbers:21012046}  Quit Date: ***  Visit Components: Discussion included one or more decision making aids. {YES NO:22349} Discussion included risk/benefits of screening. {YES NO:22349} Discussion included potential follow up diagnostic testing for abnormal scans. {YES NO:22349} Discussion included meaning and risk of over diagnosis. {YES NO:22349} Discussion included meaning and risk of False Positives. {YES NO:22349} Discussion included meaning of total radiation exposure. {YES [id number]  Counseling Included: Importance of adherence to annual lung cancer LDCT screening. {YES NO:22349} Impact of comorbidities on ability to participate in the program. {YES NO:22349} Ability and willingness to under diagnostic treatment. {YES NO:22349}  Smoking  Cessation Counseling: Current Smokers:  Discussed importance of smoking cessation. {YES [id number] Information about tobacco cessation classes and interventions provided to patient. {YES [id number] Patient provided with "ticket" for LDCT Scan. {YES NO:22349} Symptomatic Patient. {YES NO:22349}  Counseling{Symptomatic Patient:21012041} Diagnosis Code: Tobacco Use Z72.0 Asymptomatic Patient {YES NO:22349}  Counseling {Asymptomatic patient:21012042} Former Smokers:  Discussed the importance of maintaining cigarette abstinence. {YES NO:22349} Diagnosis Code: Personal History of Nicotine Dependence. Q76.195 Information about tobacco cessation classes and interventions provided to patient. {Responses; yes/no/refused:32142} Patient provided with "ticket" for LDCT Scan. {YES P5382123 Written Order for Lung Cancer Screening with LDCT placed in Epic. {Smoking cessesion custom:21012043} (CT Chest Lung Cancer Screening Low Dose W/O CM) [id number] Z12.2-Screening of respiratory organs Z87.891-Personal history of nicotine dependence   Magdalen Spatz, NP

## 2021-04-28 ENCOUNTER — Inpatient Hospital Stay: Admission: RE | Admit: 2021-04-28 | Payer: Medicare Other | Source: Ambulatory Visit

## 2021-05-13 DIAGNOSIS — F411 Generalized anxiety disorder: Secondary | ICD-10-CM | POA: Diagnosis not present

## 2021-05-13 DIAGNOSIS — F1010 Alcohol abuse, uncomplicated: Secondary | ICD-10-CM | POA: Diagnosis not present

## 2021-05-13 DIAGNOSIS — Z720 Tobacco use: Secondary | ICD-10-CM | POA: Diagnosis not present

## 2021-05-13 DIAGNOSIS — E7800 Pure hypercholesterolemia, unspecified: Secondary | ICD-10-CM | POA: Diagnosis not present

## 2021-05-13 DIAGNOSIS — R008 Other abnormalities of heart beat: Secondary | ICD-10-CM | POA: Diagnosis not present

## 2021-05-24 ENCOUNTER — Ambulatory Visit (INDEPENDENT_AMBULATORY_CARE_PROVIDER_SITE_OTHER): Payer: Medicare HMO | Admitting: Acute Care

## 2021-05-24 ENCOUNTER — Other Ambulatory Visit: Payer: Self-pay

## 2021-05-24 ENCOUNTER — Encounter: Payer: Self-pay | Admitting: Acute Care

## 2021-05-24 DIAGNOSIS — F17210 Nicotine dependence, cigarettes, uncomplicated: Secondary | ICD-10-CM | POA: Diagnosis not present

## 2021-05-24 NOTE — Patient Instructions (Signed)
Thank you for participating in the [place] Lung Cancer Screening Program. °It was our pleasure to meet you today. °We will call you with the results of your scan within the next few days. °Your scan will be assigned a Lung RADS category score by the physicians reading the scans.  °This Lung RADS score determines follow up scanning.  °See below for description of categories, and follow up screening recommendations. °We will be in touch to schedule your follow up screening annually or based on recommendations of our providers. °We will fax a copy of your scan results to your Primary Care Physician, or the physician who referred you to the program, to ensure they have the results. °Please call the office if you have any questions or concerns regarding your scanning experience or results.  °Our office number is [phone]. °Please speak with Denise Phelps, RN. She is our Lung Cancer Screening RN. °If she is unavailable when you call, please have the office staff send her a message. She will return your call at her earliest convenience. °Remember, if your scan is normal, we will scan you annually as long as you continue to meet the criteria for the program. (Age 55-77, Current smoker or smoker who has quit within the last 15 years). °If you are a smoker, remember, quitting is the single most powerful action that you can take to decrease your risk of lung cancer and other pulmonary, breathing related problems. °We know quitting is hard, and we are here to help.  °Please let us know if there is anything we can do to help you meet your goal of quitting. °If you are a former smoker, congratulations. We are proud of you! Remain smoke free! °Remember you can refer friends or family members through the number above.  °We will screen them to make sure they meet criteria for the program. °Thank you for helping us take better care of you by participating in Lung Screening. ° °You can receive free nicotine replacement therapy  ( patches, gum or mints) by calling 1-800-QUIT NOW. Please call so we can get you on the path to becoming  a non-smoker. I know it is hard, but you can do this! ° °Lung RADS Categories: ° °Lung RADS 1: no nodules or definitely non-concerning nodules.  °Recommendation is for a repeat annual scan in 12 months. ° °Lung RADS 2:  nodules that are non-concerning in appearance and behavior with a very low likelihood of becoming an active cancer. °Recommendation is for a repeat annual scan in 12 months. ° °Lung RADS 3: nodules that are probably non-concerning , includes nodules with a low likelihood of becoming an active cancer.  Recommendation is for a 6-month repeat screening scan. Often noted after an upper respiratory illness. We will be in touch to make sure you have no questions, and to schedule your 6-month scan. ° °Lung RADS 4 A: nodules with concerning findings, recommendation is most often for a follow up scan in 3 months or additional testing based on our provider's assessment of the scan. We will be in touch to make sure you have no questions and to schedule the recommended 3 month follow up scan. ° °Lung RADS 4 B:  indicates findings that are concerning. We will be in touch with you to schedule additional diagnostic testing based on our provider's  assessment of the scan. ° °Hypnosis for smoking cessation  °[employer] °[phone] ° °Acupuncture for smoking cessation  °[facility] °[phone]  °

## 2021-05-24 NOTE — Progress Notes (Addendum)
Virtual Visit via Telephone Note ? ?I connected with Timothy Santiago on 02/01/21 at  2:00 PM EST by telephone and verified that I am speaking with the correct person using two identifiers. ? ?Location: ?Patient: Home ?Provider: Working from home ?  ?I discussed the limitations, risks, security and privacy concerns of performing an evaluation and management service by telephone and the availability of in person appointments. I also discussed with the patient that there may be a patient responsible charge related to this service. The patient expressed understanding and agreed to proceed. ? ?Shared Decision Making Visit Lung Cancer Screening Program ?([phone]) ? ? ?Eligibility: ?Age 71 y.o. ?Pack Years Smoking History Calculation 32 ?(# packs/per year x # years smoked) ?Recent History of coughing up blood  no ?Unexplained weight loss? no ?( >Than 15 pounds within the last 6 months ) ?Prior History Lung / other cancer no ?(Diagnosis within the last 5 years already requiring surveillance chest CT Scans). ?Smoking Status Current Smoker ?Former Smokers: Years since quit: NA ? Quit Date: NA ? ?Visit Components: ?Discussion included one or more decision making aids. yes ?Discussion included risk/benefits of screening. yes ?Discussion included potential follow up diagnostic testing for abnormal scans. yes ?Discussion included meaning and risk of over diagnosis. yes ?Discussion included meaning and risk of False Positives. yes ?Discussion included meaning of total radiation exposure. yes ? ?Counseling Included: ?Importance of adherence to annual lung cancer LDCT screening. yes ?Impact of comorbidities on ability to participate in the program. yes ?Ability and willingness to under diagnostic treatment. yes ? ?Smoking Cessation Counseling: ?Current Smokers:  ?Discussed importance of smoking cessation. yes ?Information about tobacco cessation classes and interventions provided to patient. yes ?Patient provided with "ticket" for LDCT  Scan. yes ?Symptomatic Patient. yes ? Counseling(Intermediate counseling: > three minutes) [id number] ?Diagnosis Code: Tobacco Use Z72.0 ?Asymptomatic Patient NA ? Counseling NA ?Former Smokers:  ?Discussed the importance of maintaining cigarette abstinence. yes ?Diagnosis Code: Personal History of Nicotine Dependence. Z00.174 ?Information about tobacco cessation classes and interventions provided to patient. Yes ?Patient provided with "ticket" for LDCT Scan. yes ?Written Order for Lung Cancer Screening with LDCT placed in Epic. Yes ?(CT Chest Lung Cancer Screening Low Dose W/O CM) [id number] ?Z12.2-Screening of respiratory organs ?Z87.891-Personal history of nicotine dependence ? ? ?I spent 25 minutes of face to face time with him discussing the risks and benefits of lung cancer screening. We viewed a power point together that explained in detail the above noted topics. We took the time to pause the power point at intervals to allow for questions to be asked and answered to ensure understanding. We discussed that he had taken the single most powerful action possible to decrease his risk of developing lung cancer when he quit smoking. I counseled him to remain smoke free, and to contact me if he ever had the desire to smoke again so that I can provide resources and tools to help support the effort to remain smoke free. We discussed the time and location of the scan, and that either  Doroteo Glassman RN or I will call with the results within  24-48 hours of receiving them. He has my card and contact information in the event He needs to speak with me, in addition to a copy of the power point we reviewed as a resource. He verbalized understanding of all of the above and had no further questions upon leaving the office.  ? ? ? ?I explained to the patient that there has been a  high incidence of coronary artery disease noted on these exams. I explained that this is a non-gated exam therefore degree or severity cannot be determined.  This patient is not on statin therapy. I have asked the patient to follow-up with their PCP regarding any incidental finding of coronary artery disease and management with diet or medication as they feel is clinically indicated. The patient verbalized understanding of the above and had no further questions. ? ? ?I spent 3 minutes counseling on smoking cessation and the health risks of continued tobacco abuse  ? ? ?Albeiro Trompeter D. Harris, NP-C ?[facility] ?Personal contact information can be found on Amion  ?05/24/2021, 10:16 AM ? ? ? ? ? ? ? ? ? ?

## 2021-05-27 ENCOUNTER — Other Ambulatory Visit: Payer: Self-pay

## 2021-05-27 ENCOUNTER — Ambulatory Visit
Admission: RE | Admit: 2021-05-27 | Discharge: 2021-05-27 | Disposition: A | Discharge status: Home / Self Care | Payer: Medicare HMO | Source: Ambulatory Visit | Attending: Acute Care | Admitting: Acute Care

## 2021-05-27 DIAGNOSIS — F17210 Nicotine dependence, cigarettes, uncomplicated: Secondary | ICD-10-CM

## 2021-05-27 DIAGNOSIS — Z87891 Personal history of nicotine dependence: Secondary | ICD-10-CM

## 2021-05-30 ENCOUNTER — Other Ambulatory Visit: Payer: Self-pay | Admitting: Acute Care

## 2021-05-30 ENCOUNTER — Telehealth: Payer: Self-pay | Admitting: *Deleted

## 2021-05-30 DIAGNOSIS — Z87891 Personal history of nicotine dependence: Secondary | ICD-10-CM

## 2021-05-30 DIAGNOSIS — F17210 Nicotine dependence, cigarettes, uncomplicated: Secondary | ICD-10-CM

## 2021-05-30 DIAGNOSIS — R911 Solitary pulmonary nodule: Secondary | ICD-10-CM

## 2021-05-30 NOTE — Telephone Encounter (Signed)
Pt informed of CT results per Eric Form, NP.  PT verbalized understanding.  Copy of CT sent to PCP.  Order placed for 6 mth f/u CT. ? ?

## 2021-06-08 ENCOUNTER — Other Ambulatory Visit: Payer: Self-pay | Admitting: Family Medicine

## 2021-06-08 DIAGNOSIS — I2510 Atherosclerotic heart disease of native coronary artery without angina pectoris: Secondary | ICD-10-CM

## 2021-08-10 DIAGNOSIS — K648 Other hemorrhoids: Secondary | ICD-10-CM | POA: Diagnosis not present

## 2021-08-10 DIAGNOSIS — D120 Benign neoplasm of cecum: Secondary | ICD-10-CM | POA: Diagnosis not present

## 2021-08-10 DIAGNOSIS — D123 Benign neoplasm of transverse colon: Secondary | ICD-10-CM | POA: Diagnosis not present

## 2021-08-10 DIAGNOSIS — Z09 Encounter for follow-up examination after completed treatment for conditions other than malignant neoplasm: Secondary | ICD-10-CM | POA: Diagnosis not present

## 2021-08-10 DIAGNOSIS — Z86010 Personal history of colonic polyps: Secondary | ICD-10-CM | POA: Diagnosis not present

## 2021-08-12 DIAGNOSIS — D123 Benign neoplasm of transverse colon: Secondary | ICD-10-CM | POA: Diagnosis not present

## 2021-08-12 DIAGNOSIS — D120 Benign neoplasm of cecum: Secondary | ICD-10-CM | POA: Diagnosis not present

## 2021-12-14 ENCOUNTER — Telehealth: Payer: Self-pay

## 2021-12-14 NOTE — Telephone Encounter (Signed)
Left VM for patient to call for scheduling nodule follow up LDCT

## 2021-12-15 NOTE — Telephone Encounter (Signed)
Letter mailed as reminder for patient to schedule follow up nodule LDCT

## 2023-03-19 ENCOUNTER — Telehealth: Payer: Self-pay | Admitting: *Deleted

## 2023-03-19 NOTE — Telephone Encounter (Signed)
Attempted to contact patient. Left detailed voicemail for pt to call to schedule follow up lung screening CT. Pt [place] address and need to clarify if he is receiving care closer to that area.

## 2023-03-28 ENCOUNTER — Encounter: Payer: Self-pay | Admitting: Acute Care

## 2023-10-16 NOTE — Progress Notes (Signed)
 This encounter was created in error - please disregard.
